# Patient Record
Sex: Male | Born: 1935 | Race: White | Hispanic: No | Marital: Married | Smoking: Never smoker
Health system: Southern US, Community
[De-identification: ages and names within clinical notes are randomized; demographics above are authoritative.]

## PROBLEM LIST (undated history)

## (undated) DIAGNOSIS — E785 Hyperlipidemia, unspecified: Secondary | ICD-10-CM

## (undated) DIAGNOSIS — N184 Chronic kidney disease, stage 4 (severe): Secondary | ICD-10-CM

## (undated) DIAGNOSIS — R011 Cardiac murmur, unspecified: Secondary | ICD-10-CM

## (undated) DIAGNOSIS — I5042 Chronic combined systolic (congestive) and diastolic (congestive) heart failure: Secondary | ICD-10-CM

## (undated) DIAGNOSIS — I739 Peripheral vascular disease, unspecified: Secondary | ICD-10-CM

## (undated) DIAGNOSIS — I10 Essential (primary) hypertension: Secondary | ICD-10-CM

## (undated) DIAGNOSIS — H3530 Unspecified macular degeneration: Secondary | ICD-10-CM

## (undated) DIAGNOSIS — I2510 Atherosclerotic heart disease of native coronary artery without angina pectoris: Secondary | ICD-10-CM

## (undated) DIAGNOSIS — I779 Disorder of arteries and arterioles, unspecified: Secondary | ICD-10-CM

## (undated) DIAGNOSIS — D649 Anemia, unspecified: Secondary | ICD-10-CM

## (undated) DIAGNOSIS — I359 Nonrheumatic aortic valve disorder, unspecified: Secondary | ICD-10-CM

## (undated) DIAGNOSIS — I350 Nonrheumatic aortic (valve) stenosis: Secondary | ICD-10-CM

## (undated) HISTORY — DX: Peripheral vascular disease, unspecified: I73.9

## (undated) HISTORY — DX: Cardiac murmur, unspecified: R01.1

## (undated) HISTORY — DX: Essential (primary) hypertension: I10

## (undated) HISTORY — DX: Atherosclerotic heart disease of native coronary artery without angina pectoris: I25.10

## (undated) HISTORY — DX: Hyperlipidemia, unspecified: E78.5

## (undated) HISTORY — DX: Unspecified macular degeneration: H35.30

## (undated) HISTORY — DX: Disorder of arteries and arterioles, unspecified: I77.9

## (undated) HISTORY — DX: Nonrheumatic aortic valve disorder, unspecified: I35.9

## (undated) HISTORY — DX: Nonrheumatic aortic (valve) stenosis: I35.0

## (undated) HISTORY — DX: Chronic kidney disease, stage 4 (severe): N18.4

## (undated) HISTORY — PX: OTHER SURGICAL HISTORY: SHX169

---

## 1996-02-28 DIAGNOSIS — I2510 Atherosclerotic heart disease of native coronary artery without angina pectoris: Secondary | ICD-10-CM

## 1996-02-28 HISTORY — DX: Atherosclerotic heart disease of native coronary artery without angina pectoris: I25.10

## 1997-01-15 HISTORY — PX: CORONARY ARTERY BYPASS GRAFT: SHX141

## 1997-03-11 HISTORY — PX: OTHER SURGICAL HISTORY: SHX169

## 2010-10-19 DIAGNOSIS — I2510 Atherosclerotic heart disease of native coronary artery without angina pectoris: Secondary | ICD-10-CM

## 2010-10-19 DIAGNOSIS — I10 Essential (primary) hypertension: Secondary | ICD-10-CM

## 2010-10-19 DIAGNOSIS — N185 Chronic kidney disease, stage 5: Secondary | ICD-10-CM | POA: Insufficient documentation

## 2010-10-19 DIAGNOSIS — N184 Chronic kidney disease, stage 4 (severe): Secondary | ICD-10-CM

## 2010-10-19 DIAGNOSIS — E785 Hyperlipidemia, unspecified: Secondary | ICD-10-CM | POA: Insufficient documentation

## 2010-10-19 DIAGNOSIS — I25810 Atherosclerosis of coronary artery bypass graft(s) without angina pectoris: Secondary | ICD-10-CM | POA: Insufficient documentation

## 2010-10-19 DIAGNOSIS — I350 Nonrheumatic aortic (valve) stenosis: Secondary | ICD-10-CM | POA: Insufficient documentation

## 2010-10-19 DIAGNOSIS — I359 Nonrheumatic aortic valve disorder, unspecified: Secondary | ICD-10-CM | POA: Insufficient documentation

## 2010-10-21 ENCOUNTER — Encounter: Payer: Self-pay | Admitting: Thoracic Surgery (Cardiothoracic Vascular Surgery)

## 2010-10-24 ENCOUNTER — Encounter: Payer: Self-pay | Admitting: Thoracic Surgery (Cardiothoracic Vascular Surgery)

## 2010-10-24 ENCOUNTER — Institutional Professional Consult (permissible substitution) (INDEPENDENT_AMBULATORY_CARE_PROVIDER_SITE_OTHER): Payer: Medicare Other | Admitting: Thoracic Surgery (Cardiothoracic Vascular Surgery)

## 2010-10-24 VITALS — BP 136/70 | HR 69 | Resp 18 | Ht 67.0 in | Wt 162.0 lb

## 2010-10-24 DIAGNOSIS — H3530 Unspecified macular degeneration: Secondary | ICD-10-CM | POA: Insufficient documentation

## 2010-10-24 DIAGNOSIS — I359 Nonrheumatic aortic valve disorder, unspecified: Secondary | ICD-10-CM

## 2010-10-24 DIAGNOSIS — R011 Cardiac murmur, unspecified: Secondary | ICD-10-CM | POA: Insufficient documentation

## 2010-10-24 DIAGNOSIS — Z7189 Other specified counseling: Secondary | ICD-10-CM

## 2010-10-24 NOTE — Progress Notes (Signed)
PCP is Norm Parcel., MD Referring Provider is Lesleigh Noe, MD  Chief Complaint  Patient presents with  . Aortic Stenosis    consultation for AVR,    HPI Patient is an 77 year old gentleman from [facility] with known history of aortic stenosis. The patient under went coronary artery bypass grafting x4 in 1998 for severe three-vessel coronary artery disease associated with progressive exertional angina. The patient recovered from this uneventfully and later underwent left carotid endarterectomy for asymptomatic left internal carotid artery stenosis. Since then the patient has done remarkably well from a cardiovascular standpoint. Several years ago he was noted to have aortic stenosis and he has been followed carefully by Dr. Katrinka Blazing. He has never had any symptoms of exertional shortness of breath or chest pain. He recently had a prolonged dizzy spell without syncope. He ruled out for acute myocardial infarction. He was seen in followup by Dr. Katrinka Blazing. His most recent 2-D echocardiogram was reportedly performed on 03/02/2010. This examination was performed as an outpatient and the images from his films are not currently available. However, by report there was severe aortic stenosis with peak velocity across the valve measured 3.34 m/s. The peak and mean transvalvular gradients were estimated to be 45 and 25 mm mercury respectively the aortic valve area was estimated to be between 0.74 and 0.82 cm. Left ventricular systolic function was felt to be normal with ejection fraction estimated at 55-60%. There was moderate left atrial enlargement with mild mitral regurgitation and mild aortic regurgitation. The patient was referred for surgical consultation to discuss long-term treatment options.  Past Medical History  Diagnosis Date  . HTN (hypertension)   . Hyperlipidemia   . Severe aortic stenosis   . CAD (coronary artery disease) 1998    s/p cabg x4  . Carotid arterial disease     . Chronic kidney disease (CKD), stage IV (severe)   . Aortic valve disorders   . Heart murmur   . Macular degeneration     Past Surgical History  Procedure Date  . Carotid endarterectomy, left 03/11/1997    Dr. Hart Rochester  . Left knee arthroscopic sugery   . Right pointer finger surgery secondary to industrial accident   . Coronary artery bypass graft 01/15/97    CABGx4 by Dr Andrey Campanile (LIMA to diag + LAD, SVG to OM1 + OM2)    History reviewed. No pertinent family history.  Social History History  Substance Use Topics  . Smoking status: Never Smoker   . Smokeless tobacco: Never Used  . Alcohol Use: Yes     4oz wine per day    Current Outpatient Prescriptions  Medication Sig Dispense Refill  . aspirin 81 MG tablet Take 81 mg by mouth daily.        . brinzolamide (AZOPT) 1 % ophthalmic suspension Place 1 drop into both eyes 3 (three) times daily.        Marland Kitchen lisinopril (PRINIVIL,ZESTRIL) 20 MG tablet Take 20 mg by mouth daily.        . metoprolol (TOPROL-XL) 50 MG 24 hr tablet Take 50 mg by mouth daily.        . Multiple Vitamins-Minerals (MACUVITE EYE CARE) TABS Take 1 tablet by mouth QID.        Marland Kitchen simvastatin (ZOCOR) 20 MG tablet Take 20 mg by mouth at bedtime.          Allergies  Allergen Reactions  . Penicillins Rash     Review of Systems Review of  systems is notable for the absence of any history of chest pain or chest pressure either with activity or at rest. The patient denies any history of shortness of breath either with activity or at rest. He denies PND, orthopnea, or lower extremity edema. He had the single prolonged dizzy spell that prompted hospital evaluation recently in [place]. He did not have syncope and he has not had any further dizzy spells since that time. He said that the dizzy spell occurred when he had just gone to the bathroom. The remainder of his review of systems is largely unremarkable. He states that his activity level is quite good. His appetite  is stable. His weight is stable. Denies any problems with breathing or chronic cough. He has no difficulty swallowing. Bowel function is regular. He has no difficulty urinating. He does have problems with chronic arthritis and arthralgias afflicting his hips, more so on the left than the right. He also has some trouble with low back pain. These problems limit his mobility considerably. However, he does not require any sort of cane or walker for ambulation, he still drives an automobile, and he tends to the majority of his basic daily needs. He has not had fevers or chills. He has severely decreased vision in the left eye due to macular degeneration. He sees his dentist regularly and recently had some dental work done. The remainder of his review of systems is entirely unremarkable.  Physical Exam Physical exam is notable for a well-appearing elderly male who appears somewhat younger than his stated age. HEENT exam is unremarkable. The neck is supple. There are carotid bruits. There is no lymphadenopathy. Auscultation of the chest reveals clear breath sounds which are symmetrical bilaterally. There is a well-healed median sternotomy scar. The sternum is stable. Cardiovascular exam is notable for regular rate and rhythm. There is a crescendo decrescendo systolic murmur heard along the sternal border with radiation to the neck and across the precordium. No diastolic murmurs are noted. The abdomen is soft nondistended and nontender. There are no palpable masses. The extremities are warm and well-perfused. There is a well-healed surgical scar in the right lower leg from previous saphenous vein harvest. There is mild bilateral lower extremity edema. Distal pulses are not palpable at the ankle. Femoral pulses are possible but slightly diminished bilaterally. Neurologic examination is grossly nonfocal and symmetrical throughout. Rectal and GU exams are both deferred. The remainder of his physical exam is  noncontributory.  Diagnostic Tests Reports from 2-D echocardiogram performed [facility] on 03/02/2010 was reviewed. The results are as discussed previously.  Impression Likely severe aortic stenosis with normal left ventricular function in this elderly gentleman with history of coronary artery disease, status post coronary artery bypass grafting x4 in 1998. The patient's medical problems include severe chronic renal insufficiency. His physical mobility is somewhat diminished over the past couple of years due to degenerative arthritis and pain in his hips. Left ventricular systolic function is preserved. Overall using the STS risk calculator, the estimated risks associated with reoperation for aortic valve replacement in this elderly gentleman suggests that he would be at high risk but not prohibitive risk for elective surgery. Specifically, his risk of mortality would be calculated 9.1% with a 40% risk of combined morbidity or mortality including 3% risk of stroke, 20% risk of acute renal failure, 23% risk of ventilator-dependent respiratory failure, and a more than 20% risk of prolonged hospital length of stay. Overall I suspect that he would tolerate surgery but  it certainly would be at high risk. Trans-catheter aortic valve replacement could be an option appear risks associated with this procedure would likely be similar in terms of risk of mortality. The risk of stroke would certainly be higher with this approach, but his subsequent recovery would likely be much quicker if the procedure was successful . However, at this point I think continued medical therapy remains an option. This patient really hasn't had any symptoms that are definitely attributable to his underlying aortic stenosis. The dizzy spell is certainly concerning, and if similar episodes recur it might be reasonable to consider further workup. However, he has not had any chest discomfort or shortness of breath and he only had a  single dizzy spell without frank syncope. Finally, the peak velocity across the aortic valve reported on echocardiogram performed last January was not greater than 4. It might be reasonable to repeat a transthoracic echocardiogram and follow the patient closely. If he develops any significant signs or symptoms that his aortic valve disease has progressed, repeat catheterization would be indicated to evaluate his underlying coronary artery disease and confirmed the presence of severe aortic stenosis. Transesophageal echocardiogram might be helpful, although this may not be necessary. Transesophageal echocardiogram and cardiac gated CT scanning would be needed if he were to consider transcatheter aortic valve replacement.  Plan The patient plans to followup in the near future with Dr. Katrinka Blazing to discuss matters further. He will call and return to see [place] here as needed. All of his questions have been addressed.

## 2010-10-24 NOTE — Patient Instructions (Signed)
Call to schedule followup appointment with Dr. Katrinka Blazing in the near future. Call promptly should he develop any symptoms of chest pain, shortness of breath, or recurrent dizzy spells with without fainting.

## 2011-03-02 DIAGNOSIS — E7800 Pure hypercholesterolemia, unspecified: Secondary | ICD-10-CM | POA: Diagnosis not present

## 2011-03-02 DIAGNOSIS — I10 Essential (primary) hypertension: Secondary | ICD-10-CM | POA: Diagnosis not present

## 2011-03-02 DIAGNOSIS — I2510 Atherosclerotic heart disease of native coronary artery without angina pectoris: Secondary | ICD-10-CM | POA: Diagnosis not present

## 2011-03-02 DIAGNOSIS — I359 Nonrheumatic aortic valve disorder, unspecified: Secondary | ICD-10-CM | POA: Diagnosis not present

## 2011-03-06 DIAGNOSIS — D3190 Benign neoplasm of unspecified part of unspecified eye: Secondary | ICD-10-CM | POA: Diagnosis not present

## 2011-03-06 DIAGNOSIS — H4060X0 Glaucoma secondary to drugs, unspecified eye, stage unspecified: Secondary | ICD-10-CM | POA: Diagnosis not present

## 2011-03-06 DIAGNOSIS — H409 Unspecified glaucoma: Secondary | ICD-10-CM | POA: Diagnosis not present

## 2011-03-15 DIAGNOSIS — H353290 Exudative age-related macular degeneration, unspecified eye, stage unspecified: Secondary | ICD-10-CM | POA: Diagnosis not present

## 2011-03-15 DIAGNOSIS — H35059 Retinal neovascularization, unspecified, unspecified eye: Secondary | ICD-10-CM | POA: Diagnosis not present

## 2011-04-06 DIAGNOSIS — N184 Chronic kidney disease, stage 4 (severe): Secondary | ICD-10-CM | POA: Diagnosis not present

## 2011-04-06 DIAGNOSIS — I10 Essential (primary) hypertension: Secondary | ICD-10-CM | POA: Diagnosis not present

## 2011-04-26 DIAGNOSIS — H353290 Exudative age-related macular degeneration, unspecified eye, stage unspecified: Secondary | ICD-10-CM | POA: Diagnosis not present

## 2011-04-26 DIAGNOSIS — H35059 Retinal neovascularization, unspecified, unspecified eye: Secondary | ICD-10-CM | POA: Diagnosis not present

## 2011-05-17 DIAGNOSIS — E785 Hyperlipidemia, unspecified: Secondary | ICD-10-CM | POA: Diagnosis not present

## 2011-05-17 DIAGNOSIS — N400 Enlarged prostate without lower urinary tract symptoms: Secondary | ICD-10-CM | POA: Diagnosis not present

## 2011-05-17 DIAGNOSIS — I2510 Atherosclerotic heart disease of native coronary artery without angina pectoris: Secondary | ICD-10-CM | POA: Diagnosis not present

## 2011-05-17 DIAGNOSIS — M25559 Pain in unspecified hip: Secondary | ICD-10-CM | POA: Diagnosis not present

## 2011-05-17 DIAGNOSIS — N19 Unspecified kidney failure: Secondary | ICD-10-CM | POA: Diagnosis not present

## 2011-06-26 DIAGNOSIS — N400 Enlarged prostate without lower urinary tract symptoms: Secondary | ICD-10-CM | POA: Diagnosis not present

## 2011-06-26 DIAGNOSIS — R972 Elevated prostate specific antigen [PSA]: Secondary | ICD-10-CM | POA: Diagnosis not present

## 2011-06-26 DIAGNOSIS — N419 Inflammatory disease of prostate, unspecified: Secondary | ICD-10-CM | POA: Diagnosis not present

## 2011-06-28 DIAGNOSIS — H35059 Retinal neovascularization, unspecified, unspecified eye: Secondary | ICD-10-CM | POA: Diagnosis not present

## 2011-06-28 DIAGNOSIS — H35359 Cystoid macular degeneration, unspecified eye: Secondary | ICD-10-CM | POA: Diagnosis not present

## 2011-06-28 DIAGNOSIS — H353290 Exudative age-related macular degeneration, unspecified eye, stage unspecified: Secondary | ICD-10-CM | POA: Diagnosis not present

## 2011-07-03 DIAGNOSIS — H4060X0 Glaucoma secondary to drugs, unspecified eye, stage unspecified: Secondary | ICD-10-CM | POA: Diagnosis not present

## 2011-07-03 DIAGNOSIS — T380X5A Adverse effect of glucocorticoids and synthetic analogues, initial encounter: Secondary | ICD-10-CM | POA: Diagnosis not present

## 2011-07-03 DIAGNOSIS — H409 Unspecified glaucoma: Secondary | ICD-10-CM | POA: Diagnosis not present

## 2011-07-19 DIAGNOSIS — H4011X0 Primary open-angle glaucoma, stage unspecified: Secondary | ICD-10-CM | POA: Diagnosis not present

## 2011-07-19 DIAGNOSIS — H409 Unspecified glaucoma: Secondary | ICD-10-CM | POA: Diagnosis not present

## 2011-08-02 DIAGNOSIS — N184 Chronic kidney disease, stage 4 (severe): Secondary | ICD-10-CM | POA: Diagnosis not present

## 2011-08-02 DIAGNOSIS — I10 Essential (primary) hypertension: Secondary | ICD-10-CM | POA: Diagnosis not present

## 2011-08-18 DIAGNOSIS — H2510 Age-related nuclear cataract, unspecified eye: Secondary | ICD-10-CM | POA: Diagnosis not present

## 2011-08-18 DIAGNOSIS — H353290 Exudative age-related macular degeneration, unspecified eye, stage unspecified: Secondary | ICD-10-CM | POA: Diagnosis not present

## 2011-08-18 DIAGNOSIS — H43399 Other vitreous opacities, unspecified eye: Secondary | ICD-10-CM | POA: Diagnosis not present

## 2011-08-18 DIAGNOSIS — Z961 Presence of intraocular lens: Secondary | ICD-10-CM | POA: Diagnosis not present

## 2011-08-18 DIAGNOSIS — H538 Other visual disturbances: Secondary | ICD-10-CM | POA: Diagnosis not present

## 2011-08-18 DIAGNOSIS — H4011X0 Primary open-angle glaucoma, stage unspecified: Secondary | ICD-10-CM | POA: Diagnosis not present

## 2011-08-29 DIAGNOSIS — H269 Unspecified cataract: Secondary | ICD-10-CM | POA: Diagnosis not present

## 2011-08-29 DIAGNOSIS — H2510 Age-related nuclear cataract, unspecified eye: Secondary | ICD-10-CM | POA: Diagnosis not present

## 2011-09-13 DIAGNOSIS — I10 Essential (primary) hypertension: Secondary | ICD-10-CM | POA: Diagnosis not present

## 2011-09-13 DIAGNOSIS — N184 Chronic kidney disease, stage 4 (severe): Secondary | ICD-10-CM | POA: Diagnosis not present

## 2011-10-02 ENCOUNTER — Ambulatory Visit: Payer: Medicare Other | Admitting: Physician Assistant

## 2011-10-10 DIAGNOSIS — L570 Actinic keratosis: Secondary | ICD-10-CM | POA: Diagnosis not present

## 2011-10-10 DIAGNOSIS — Z85820 Personal history of malignant melanoma of skin: Secondary | ICD-10-CM | POA: Diagnosis not present

## 2011-10-10 DIAGNOSIS — D485 Neoplasm of uncertain behavior of skin: Secondary | ICD-10-CM | POA: Diagnosis not present

## 2011-10-11 DIAGNOSIS — H353290 Exudative age-related macular degeneration, unspecified eye, stage unspecified: Secondary | ICD-10-CM | POA: Diagnosis not present

## 2011-11-09 DIAGNOSIS — N184 Chronic kidney disease, stage 4 (severe): Secondary | ICD-10-CM | POA: Diagnosis not present

## 2011-11-09 DIAGNOSIS — I10 Essential (primary) hypertension: Secondary | ICD-10-CM | POA: Diagnosis not present

## 2011-11-09 DIAGNOSIS — Z23 Encounter for immunization: Secondary | ICD-10-CM | POA: Diagnosis not present

## 2011-11-29 DIAGNOSIS — I359 Nonrheumatic aortic valve disorder, unspecified: Secondary | ICD-10-CM | POA: Diagnosis not present

## 2011-11-29 DIAGNOSIS — N189 Chronic kidney disease, unspecified: Secondary | ICD-10-CM | POA: Diagnosis not present

## 2011-11-29 DIAGNOSIS — E785 Hyperlipidemia, unspecified: Secondary | ICD-10-CM | POA: Diagnosis not present

## 2011-12-01 DIAGNOSIS — H353290 Exudative age-related macular degeneration, unspecified eye, stage unspecified: Secondary | ICD-10-CM | POA: Diagnosis not present

## 2011-12-01 DIAGNOSIS — H35059 Retinal neovascularization, unspecified, unspecified eye: Secondary | ICD-10-CM | POA: Diagnosis not present

## 2012-01-10 DIAGNOSIS — N184 Chronic kidney disease, stage 4 (severe): Secondary | ICD-10-CM | POA: Diagnosis not present

## 2012-01-10 DIAGNOSIS — I10 Essential (primary) hypertension: Secondary | ICD-10-CM | POA: Diagnosis not present

## 2012-01-18 DIAGNOSIS — H353290 Exudative age-related macular degeneration, unspecified eye, stage unspecified: Secondary | ICD-10-CM | POA: Diagnosis not present

## 2012-01-18 DIAGNOSIS — H35059 Retinal neovascularization, unspecified, unspecified eye: Secondary | ICD-10-CM | POA: Diagnosis not present

## 2012-02-02 DIAGNOSIS — Z13220 Encounter for screening for lipoid disorders: Secondary | ICD-10-CM | POA: Diagnosis not present

## 2012-02-02 DIAGNOSIS — E7800 Pure hypercholesterolemia, unspecified: Secondary | ICD-10-CM | POA: Diagnosis not present

## 2012-02-02 DIAGNOSIS — Z0000 Encounter for general adult medical examination without abnormal findings: Secondary | ICD-10-CM | POA: Diagnosis not present

## 2012-02-07 DIAGNOSIS — H4060X0 Glaucoma secondary to drugs, unspecified eye, stage unspecified: Secondary | ICD-10-CM | POA: Diagnosis not present

## 2012-02-07 DIAGNOSIS — H353190 Nonexudative age-related macular degeneration, unspecified eye, stage unspecified: Secondary | ICD-10-CM | POA: Diagnosis not present

## 2012-02-07 DIAGNOSIS — Z961 Presence of intraocular lens: Secondary | ICD-10-CM | POA: Diagnosis not present

## 2012-02-07 DIAGNOSIS — H409 Unspecified glaucoma: Secondary | ICD-10-CM | POA: Diagnosis not present

## 2012-02-09 DIAGNOSIS — Z13220 Encounter for screening for lipoid disorders: Secondary | ICD-10-CM | POA: Diagnosis not present

## 2012-02-09 DIAGNOSIS — E7800 Pure hypercholesterolemia, unspecified: Secondary | ICD-10-CM | POA: Diagnosis not present

## 2012-03-06 DIAGNOSIS — I2510 Atherosclerotic heart disease of native coronary artery without angina pectoris: Secondary | ICD-10-CM | POA: Diagnosis not present

## 2012-03-06 DIAGNOSIS — I5032 Chronic diastolic (congestive) heart failure: Secondary | ICD-10-CM | POA: Diagnosis not present

## 2012-03-06 DIAGNOSIS — I359 Nonrheumatic aortic valve disorder, unspecified: Secondary | ICD-10-CM | POA: Diagnosis not present

## 2012-03-06 DIAGNOSIS — I10 Essential (primary) hypertension: Secondary | ICD-10-CM | POA: Diagnosis not present

## 2012-03-06 DIAGNOSIS — N184 Chronic kidney disease, stage 4 (severe): Secondary | ICD-10-CM | POA: Diagnosis not present

## 2012-03-11 DIAGNOSIS — I10 Essential (primary) hypertension: Secondary | ICD-10-CM | POA: Diagnosis not present

## 2012-03-11 DIAGNOSIS — N184 Chronic kidney disease, stage 4 (severe): Secondary | ICD-10-CM | POA: Diagnosis not present

## 2012-03-15 DIAGNOSIS — H35059 Retinal neovascularization, unspecified, unspecified eye: Secondary | ICD-10-CM | POA: Diagnosis not present

## 2012-03-15 DIAGNOSIS — H353290 Exudative age-related macular degeneration, unspecified eye, stage unspecified: Secondary | ICD-10-CM | POA: Diagnosis not present

## 2012-05-09 DIAGNOSIS — I10 Essential (primary) hypertension: Secondary | ICD-10-CM | POA: Diagnosis not present

## 2012-05-09 DIAGNOSIS — N184 Chronic kidney disease, stage 4 (severe): Secondary | ICD-10-CM | POA: Diagnosis not present

## 2012-05-09 DIAGNOSIS — H353290 Exudative age-related macular degeneration, unspecified eye, stage unspecified: Secondary | ICD-10-CM | POA: Diagnosis not present

## 2012-05-09 DIAGNOSIS — H35059 Retinal neovascularization, unspecified, unspecified eye: Secondary | ICD-10-CM | POA: Diagnosis not present

## 2012-07-11 DIAGNOSIS — I10 Essential (primary) hypertension: Secondary | ICD-10-CM | POA: Diagnosis not present

## 2012-07-11 DIAGNOSIS — N184 Chronic kidney disease, stage 4 (severe): Secondary | ICD-10-CM | POA: Diagnosis not present

## 2012-07-18 DIAGNOSIS — H35059 Retinal neovascularization, unspecified, unspecified eye: Secondary | ICD-10-CM | POA: Diagnosis not present

## 2012-07-18 DIAGNOSIS — H353290 Exudative age-related macular degeneration, unspecified eye, stage unspecified: Secondary | ICD-10-CM | POA: Diagnosis not present

## 2012-07-19 DIAGNOSIS — M25559 Pain in unspecified hip: Secondary | ICD-10-CM | POA: Diagnosis not present

## 2012-07-19 DIAGNOSIS — M545 Low back pain: Secondary | ICD-10-CM | POA: Diagnosis not present

## 2012-09-03 DIAGNOSIS — I359 Nonrheumatic aortic valve disorder, unspecified: Secondary | ICD-10-CM | POA: Diagnosis not present

## 2012-09-03 DIAGNOSIS — I2510 Atherosclerotic heart disease of native coronary artery without angina pectoris: Secondary | ICD-10-CM | POA: Diagnosis not present

## 2012-09-03 DIAGNOSIS — I10 Essential (primary) hypertension: Secondary | ICD-10-CM | POA: Diagnosis not present

## 2012-09-03 DIAGNOSIS — I5032 Chronic diastolic (congestive) heart failure: Secondary | ICD-10-CM | POA: Diagnosis not present

## 2012-09-03 DIAGNOSIS — N184 Chronic kidney disease, stage 4 (severe): Secondary | ICD-10-CM | POA: Diagnosis not present

## 2012-09-12 DIAGNOSIS — I10 Essential (primary) hypertension: Secondary | ICD-10-CM | POA: Diagnosis not present

## 2012-09-12 DIAGNOSIS — N184 Chronic kidney disease, stage 4 (severe): Secondary | ICD-10-CM | POA: Diagnosis not present

## 2012-09-24 DIAGNOSIS — H353290 Exudative age-related macular degeneration, unspecified eye, stage unspecified: Secondary | ICD-10-CM | POA: Diagnosis not present

## 2012-09-24 DIAGNOSIS — H35059 Retinal neovascularization, unspecified, unspecified eye: Secondary | ICD-10-CM | POA: Diagnosis not present

## 2012-09-24 DIAGNOSIS — H35369 Drusen (degenerative) of macula, unspecified eye: Secondary | ICD-10-CM | POA: Diagnosis not present

## 2012-09-24 DIAGNOSIS — H353190 Nonexudative age-related macular degeneration, unspecified eye, stage unspecified: Secondary | ICD-10-CM | POA: Diagnosis not present

## 2012-11-07 DIAGNOSIS — I10 Essential (primary) hypertension: Secondary | ICD-10-CM | POA: Diagnosis not present

## 2012-11-07 DIAGNOSIS — N184 Chronic kidney disease, stage 4 (severe): Secondary | ICD-10-CM | POA: Diagnosis not present

## 2012-11-12 DIAGNOSIS — Z23 Encounter for immunization: Secondary | ICD-10-CM | POA: Diagnosis not present

## 2012-12-09 DIAGNOSIS — I10 Essential (primary) hypertension: Secondary | ICD-10-CM | POA: Diagnosis not present

## 2012-12-09 DIAGNOSIS — N184 Chronic kidney disease, stage 4 (severe): Secondary | ICD-10-CM | POA: Diagnosis not present

## 2012-12-25 DIAGNOSIS — H353290 Exudative age-related macular degeneration, unspecified eye, stage unspecified: Secondary | ICD-10-CM | POA: Diagnosis not present

## 2012-12-25 DIAGNOSIS — H35059 Retinal neovascularization, unspecified, unspecified eye: Secondary | ICD-10-CM | POA: Diagnosis not present

## 2013-01-14 DIAGNOSIS — Z85828 Personal history of other malignant neoplasm of skin: Secondary | ICD-10-CM | POA: Diagnosis not present

## 2013-01-14 DIAGNOSIS — L570 Actinic keratosis: Secondary | ICD-10-CM | POA: Diagnosis not present

## 2013-01-20 DIAGNOSIS — N184 Chronic kidney disease, stage 4 (severe): Secondary | ICD-10-CM | POA: Diagnosis not present

## 2013-01-20 DIAGNOSIS — I10 Essential (primary) hypertension: Secondary | ICD-10-CM | POA: Diagnosis not present

## 2013-02-04 ENCOUNTER — Encounter: Payer: Self-pay | Admitting: Interventional Cardiology

## 2013-02-04 ENCOUNTER — Encounter: Payer: Self-pay | Admitting: *Deleted

## 2013-02-05 ENCOUNTER — Encounter: Payer: Self-pay | Admitting: Interventional Cardiology

## 2013-02-05 ENCOUNTER — Ambulatory Visit (INDEPENDENT_AMBULATORY_CARE_PROVIDER_SITE_OTHER): Payer: Medicare Other | Admitting: Interventional Cardiology

## 2013-02-05 VITALS — BP 140/82 | HR 66 | Ht 66.0 in | Wt 169.0 lb

## 2013-02-05 DIAGNOSIS — I2510 Atherosclerotic heart disease of native coronary artery without angina pectoris: Secondary | ICD-10-CM | POA: Diagnosis not present

## 2013-02-05 DIAGNOSIS — N184 Chronic kidney disease, stage 4 (severe): Secondary | ICD-10-CM | POA: Diagnosis not present

## 2013-02-05 DIAGNOSIS — Z0000 Encounter for general adult medical examination without abnormal findings: Secondary | ICD-10-CM | POA: Diagnosis not present

## 2013-02-05 DIAGNOSIS — Z23 Encounter for immunization: Secondary | ICD-10-CM | POA: Diagnosis not present

## 2013-02-05 DIAGNOSIS — Z79899 Other long term (current) drug therapy: Secondary | ICD-10-CM | POA: Diagnosis not present

## 2013-02-05 DIAGNOSIS — E785 Hyperlipidemia, unspecified: Secondary | ICD-10-CM

## 2013-02-05 DIAGNOSIS — E7800 Pure hypercholesterolemia, unspecified: Secondary | ICD-10-CM | POA: Diagnosis not present

## 2013-02-05 DIAGNOSIS — I10 Essential (primary) hypertension: Secondary | ICD-10-CM

## 2013-02-05 DIAGNOSIS — I359 Nonrheumatic aortic valve disorder, unspecified: Secondary | ICD-10-CM

## 2013-02-05 DIAGNOSIS — I779 Disorder of arteries and arterioles, unspecified: Secondary | ICD-10-CM | POA: Diagnosis not present

## 2013-02-05 NOTE — Patient Instructions (Signed)
Your physician recommends that you continue on your current medications as directed. Please refer to the Current Medication list given to you today.  Your physician wants you to follow-up in: 6 months You will receive a reminder letter in the mail two months in advance. If you don't receive a letter, please call our office to schedule the follow-up appointment.  Call the office if you are experiencing the following symptoms: Chest pain, Shortness of breath, Syncope(passing out). [phone]

## 2013-02-05 NOTE — Progress Notes (Signed)
Patient ID: Shawn Long, male   DOB: Nov 13, 1922, 79 y.o.   MRN: [id number]    [address], [address] [place], [place]  [address] Phone: [phone] Fax:  [phone]  Date:  02/05/2013   ID:  Shawn Long, DOB 05/02/1922, MRN [id number]  PCP:  Norm Parcel., MD   ASSESSMENT:  1. Severe aortic stenosis, asymptomatic 2. Coronary artery disease with prior coronary bypass surgery. No angina. 3. Hypertension under control 4. Stage IV chronic kidney disease being followed by Dr. Christell Constant in [facility].  PLAN:  1. In absence of cardiovascular symptoms, no specific plans for further cardiac evaluation or invasive procedures. 2. Cautioned to call immediately if angina, dyspnea, or syncope. 3. Continue to follow kidney function (Dr. Christell Constant)   SUBJECTIVE: AIRON Long is a 79 y.o. male who denies angina, syncope, and dyspnea. There is no lower extremity swelling. According to the patient, he has been told by Dr. Christell Constant that his renal function is stable. He is still ambulatory. He is accompanied today by his wife. We had discussion concerning improving outcomes with TAVR.   Wt Readings from Last 3 Encounters:  02/05/13 169 lb (76.658 kg)  10/24/10 162 lb (73.483 kg)     Past Medical History  Diagnosis Date  . HTN (hypertension)   . Hyperlipidemia   . Severe aortic stenosis   . CAD (coronary artery disease) 1998    s/p cabg x4  . Carotid arterial disease   . Chronic kidney disease (CKD), stage IV (severe)   . Aortic valve disorders   . Heart murmur   . Macular degeneration     Current Outpatient Prescriptions  Medication Sig Dispense Refill  . aspirin 81 MG tablet Take 81 mg by mouth daily.        . brinzolamide (AZOPT) 1 % ophthalmic suspension Place 1 drop into both eyes 3 (three) times daily.        Marland Kitchen latanoprost (XALATAN) 0.005 % ophthalmic solution 1 drop at bedtime.      . metoprolol (TOPROL-XL) 50 MG 24 hr tablet Take 50 mg by mouth daily.        . Misc Natural  Products (OSTEO BI-FLEX ADV JOINT SHIELD PO) Take by mouth.      . Multiple Vitamins-Minerals (MACUVITE EYE CARE) TABS Take 1 tablet by mouth QID.        Marland Kitchen simvastatin (ZOCOR) 20 MG tablet Take 20 mg by mouth at bedtime.         No current facility-administered medications for this visit.    Allergies:    Allergies  Allergen Reactions  . Penicillins Rash    Social History:  The patient  reports that he has never smoked. He has never used smokeless tobacco. He reports that he drinks alcohol. He reports that he does not use illicit drugs.   ROS:  Please see the history of present illness.   All other systems reviewed and negative.   OBJECTIVE: VS:  BP 140/82  Pulse 66  Ht 5\' 6"  (1.676 m)  Wt 169 lb (76.658 kg)  BMI 27.29 kg/m2 Well nourished, well developed, in no acute distress, elderly  HEENT: normal Neck: JVD flat. Carotid bruit bilateral transmitted bruit  Cardiac:  normal S1, S2; RRR; 4/6 crescendo decrescendo systolic murmur of aortic stenosis. No diastolic murmur.  Lungs:  clear to auscultation bilaterally, no wheezing, rhonchi or rales Abd: soft, nontender, no hepatomegaly Ext: Edema absent. Pulses 2+  Skin: warm and dry  Neuro:  CNs 2-12 intact, no focal abnormalities noted  EKG:  Normal sinus rhythm with biatrial abnormality, first-degree AV block, no significant change compared to prior       Signed, Darci Needle III, MD 02/05/2013 11:31 AM  Past Medical History  Hypertension   Hyperlipidemia   Coronary artery disease, status post bypass surgery, 1998 (Dr. 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 Christell Constant)   Anemia   Aortic valve stenosis (Dr. Katrinka Blazing)

## 2013-02-17 DIAGNOSIS — IMO0002 Reserved for concepts with insufficient information to code with codable children: Secondary | ICD-10-CM | POA: Diagnosis not present

## 2013-02-17 DIAGNOSIS — S0180XA Unspecified open wound of other part of head, initial encounter: Secondary | ICD-10-CM | POA: Diagnosis not present

## 2013-03-25 DIAGNOSIS — H353290 Exudative age-related macular degeneration, unspecified eye, stage unspecified: Secondary | ICD-10-CM | POA: Diagnosis not present

## 2013-03-25 DIAGNOSIS — H35059 Retinal neovascularization, unspecified, unspecified eye: Secondary | ICD-10-CM | POA: Diagnosis not present

## 2013-04-25 DIAGNOSIS — I10 Essential (primary) hypertension: Secondary | ICD-10-CM | POA: Diagnosis not present

## 2013-04-25 DIAGNOSIS — N184 Chronic kidney disease, stage 4 (severe): Secondary | ICD-10-CM | POA: Diagnosis not present

## 2013-06-18 DIAGNOSIS — H524 Presbyopia: Secondary | ICD-10-CM | POA: Diagnosis not present

## 2013-06-18 DIAGNOSIS — H35059 Retinal neovascularization, unspecified, unspecified eye: Secondary | ICD-10-CM | POA: Diagnosis not present

## 2013-06-18 DIAGNOSIS — H409 Unspecified glaucoma: Secondary | ICD-10-CM | POA: Diagnosis not present

## 2013-06-18 DIAGNOSIS — H4011X0 Primary open-angle glaucoma, stage unspecified: Secondary | ICD-10-CM | POA: Diagnosis not present

## 2013-06-18 DIAGNOSIS — H353290 Exudative age-related macular degeneration, unspecified eye, stage unspecified: Secondary | ICD-10-CM | POA: Diagnosis not present

## 2013-06-24 DIAGNOSIS — H35369 Drusen (degenerative) of macula, unspecified eye: Secondary | ICD-10-CM | POA: Diagnosis not present

## 2013-06-24 DIAGNOSIS — H353290 Exudative age-related macular degeneration, unspecified eye, stage unspecified: Secondary | ICD-10-CM | POA: Diagnosis not present

## 2013-06-24 DIAGNOSIS — H4011X0 Primary open-angle glaucoma, stage unspecified: Secondary | ICD-10-CM | POA: Diagnosis not present

## 2013-07-24 DIAGNOSIS — N184 Chronic kidney disease, stage 4 (severe): Secondary | ICD-10-CM | POA: Diagnosis not present

## 2013-07-24 DIAGNOSIS — I10 Essential (primary) hypertension: Secondary | ICD-10-CM | POA: Diagnosis not present

## 2013-08-21 DIAGNOSIS — L570 Actinic keratosis: Secondary | ICD-10-CM | POA: Diagnosis not present

## 2013-08-21 DIAGNOSIS — Z85828 Personal history of other malignant neoplasm of skin: Secondary | ICD-10-CM | POA: Diagnosis not present

## 2013-08-21 DIAGNOSIS — D0420 Carcinoma in situ of skin of unspecified ear and external auricular canal: Secondary | ICD-10-CM | POA: Diagnosis not present

## 2013-09-04 ENCOUNTER — Ambulatory Visit: Payer: Medicare Other | Admitting: Interventional Cardiology

## 2013-09-15 ENCOUNTER — Encounter: Payer: Self-pay | Admitting: Interventional Cardiology

## 2013-09-15 ENCOUNTER — Ambulatory Visit (INDEPENDENT_AMBULATORY_CARE_PROVIDER_SITE_OTHER): Payer: Medicare Other | Admitting: Interventional Cardiology

## 2013-09-15 VITALS — BP 137/64 | HR 73 | Ht 67.0 in | Wt 170.0 lb

## 2013-09-15 DIAGNOSIS — I359 Nonrheumatic aortic valve disorder, unspecified: Secondary | ICD-10-CM

## 2013-09-15 DIAGNOSIS — E785 Hyperlipidemia, unspecified: Secondary | ICD-10-CM

## 2013-09-15 DIAGNOSIS — I10 Essential (primary) hypertension: Secondary | ICD-10-CM | POA: Diagnosis not present

## 2013-09-15 DIAGNOSIS — I25810 Atherosclerosis of coronary artery bypass graft(s) without angina pectoris: Secondary | ICD-10-CM

## 2013-09-15 DIAGNOSIS — I350 Nonrheumatic aortic (valve) stenosis: Secondary | ICD-10-CM

## 2013-09-15 NOTE — Patient Instructions (Addendum)
Your physician has requested that you have an echocardiogram. Echocardiography is a painless test that uses sound waves to create images of your heart. It provides your doctor with information about the size and shape of your heart and how well your heart's chambers and valves are working. This procedure takes approximately one hour. There are no restrictions for this procedure.  Your physician wants you to follow-up in: 6 months with Dr. [name]. You will receive a reminder letter in the mail two months in advance. If you don't receive a letter, please call our office to schedule the follow-up appointment.

## 2013-09-15 NOTE — Progress Notes (Signed)
Patient ID: Shawn Long, male   DOB: 1922-12-11, 80 y.o.   MRN: [id number]    [address] [place],   [address] Phone: [phone] Fax:  [phone]  Date:  09/15/2013   ID:  Shawn Long, DOB January 29, 1923, MRN [id number]  PCP:  Tonye Becket., MD   ASSESSMENT:  1. Severe aortic stenosis, symptomatically stable 2. Coronary artery disease, asymptomatic 3. Hypertension, controlled 4. CKD., stage IV, stable  PLAN:  1. 2-D Doppler echocardiogram to reassess left ear and LV function 2. We again had a considerable conversation concerning possible management of aortic stenosis with TAVR. Given the patient's renal impairment, he has been reluctant to have any evaluation for fear of precipitating dialysis. He does state however that if he develops heart failure or symptoms related to aortic stenosis, he would like to [place] for an aggressive treatment regimen since he feels well otherwise. We discussed his risk of sudden death being greater than 1% per year and possibly an excess of 5% per year. We discussed the possibility that symptoms could be acute in onset and prevent adequate planning and subsequent TAVR. He refuses any specific cardiac workup at this time other than echocardiography. 3. Continue followup with local nephrologist   SUBJECTIVE: Shawn Long is a 80 y.o. male is accompanied by his wife and has no cardiovascular complaints. He specifically denies syncope/dizziness, angina, and dyspnea. Renal function is been stable with a creatinine of around 3.5 when checked in February by his nephrologist   Wt Readings from Last 3 Encounters:  09/15/13 170 lb (77.111 kg)  02/05/13 169 lb (76.658 kg)  10/24/10 162 lb (73.483 kg)     Past Medical History  Diagnosis Date  . HTN (hypertension)   . Hyperlipidemia   . Severe aortic stenosis   . CAD (coronary artery disease) 1998    s/p cabg x4  . Carotid arterial disease   . Chronic kidney disease (CKD), stage IV  (severe)   . Aortic valve disorders   . Heart murmur   . Macular degeneration     Current Outpatient Prescriptions  Medication Sig Dispense Refill  . aspirin 81 MG tablet Take 81 mg by mouth daily.        . brinzolamide (AZOPT) 1 % ophthalmic suspension Place 1 drop into both eyes 3 (three) times daily.        . furosemide (LASIX) 20 MG tablet       . latanoprost (XALATAN) 0.005 % ophthalmic solution 1 drop at bedtime.      . metoprolol (TOPROL-XL) 50 MG 24 hr tablet Take 50 mg by mouth daily.        . Misc Natural Products (OSTEO BI-FLEX ADV JOINT SHIELD PO) Take by mouth.      . Multiple Vitamins-Minerals (MACUVITE EYE CARE) TABS Take 1 tablet by mouth QID.        Marland Kitchen simvastatin (ZOCOR) 20 MG tablet Take 20 mg by mouth at bedtime.         No current facility-administered medications for this visit.    Allergies:    Allergies  Allergen Reactions  . Penicillins Rash    Social History:  The patient  reports that he has never smoked. He has never used smokeless tobacco. He reports that he drinks alcohol. He reports that he does not use illicit drugs.   ROS:  Please see the history of present illness.   He denies edema. No orthopnea PND. Appetite is been  stable. No palpitations. No transient neurological symptoms.   All other systems reviewed and negative.   OBJECTIVE: VS:  BP 137/64  Pulse 73  Ht 5\' 7"  (1.702 m)  Wt 170 lb (77.111 kg)  BMI 26.62 kg/m2 Well nourished, well developed, in no acute distress, appears than stated age 56: normal Neck: JVD flat. Carotid bruit markedly decreased carotid upstroke. Transmitted bruits bilateral.  Cardiac:  normal S1, S2; RRR; no murmur Lungs:  clear to auscultation bilaterally, no wheezing, rhonchi or rales Abd: soft, nontender, no hepatomegaly Ext: Edema trace bilateral. Pulses 2+ upper extremities Skin: warm and dry Neuro:  CNs 2-12 intact, no focal abnormalities noted  EKG:  Not perform       Signed, Illene Labrador III,  MD 09/15/2013 2:01 PM

## 2013-09-25 ENCOUNTER — Ambulatory Visit (HOSPITAL_COMMUNITY): Payer: Medicare Other | Attending: Interventional Cardiology | Admitting: Radiology

## 2013-09-25 DIAGNOSIS — I359 Nonrheumatic aortic valve disorder, unspecified: Secondary | ICD-10-CM | POA: Diagnosis not present

## 2013-09-25 DIAGNOSIS — I350 Nonrheumatic aortic (valve) stenosis: Secondary | ICD-10-CM

## 2013-09-25 NOTE — Progress Notes (Signed)
Echocardiogram performed.  

## 2013-10-03 ENCOUNTER — Telehealth: Payer: Self-pay

## 2013-10-03 NOTE — Telephone Encounter (Signed)
Message copied by Lamar Laundry on Fri Oct 03, 2013  2:08 PM ------      Message from: Daneen Schick      Created: Fri Sep 26, 2013  2:59 PM       Echo reveals severe Aortic Valve stenosis but not significantly changed since prior study. Maintain current course,. ------

## 2013-10-03 NOTE — Telephone Encounter (Signed)
called to pt echo results.lmtcb

## 2013-10-03 NOTE — Telephone Encounter (Signed)
Follow up  ° ° °Patient returning call back to nurse  °

## 2013-10-06 NOTE — Telephone Encounter (Signed)
Follow up ° ° ° ° ° °Returning Lisa's call °

## 2013-10-06 NOTE — Telephone Encounter (Signed)
returned pt call. called to gove pt echo results.lmtcb

## 2013-10-25 ENCOUNTER — Encounter: Payer: Self-pay | Admitting: Interventional Cardiology

## 2013-11-21 DIAGNOSIS — Z23 Encounter for immunization: Secondary | ICD-10-CM | POA: Diagnosis not present

## 2013-11-25 DIAGNOSIS — H353190 Nonexudative age-related macular degeneration, unspecified eye, stage unspecified: Secondary | ICD-10-CM | POA: Diagnosis not present

## 2013-11-25 DIAGNOSIS — H4011X0 Primary open-angle glaucoma, stage unspecified: Secondary | ICD-10-CM | POA: Diagnosis not present

## 2013-11-25 DIAGNOSIS — H353290 Exudative age-related macular degeneration, unspecified eye, stage unspecified: Secondary | ICD-10-CM | POA: Diagnosis not present

## 2013-12-23 DIAGNOSIS — Z08 Encounter for follow-up examination after completed treatment for malignant neoplasm: Secondary | ICD-10-CM | POA: Diagnosis not present

## 2013-12-23 DIAGNOSIS — L570 Actinic keratosis: Secondary | ICD-10-CM | POA: Diagnosis not present

## 2013-12-23 DIAGNOSIS — Z85820 Personal history of malignant melanoma of skin: Secondary | ICD-10-CM | POA: Diagnosis not present

## 2014-01-27 ENCOUNTER — Encounter: Payer: Self-pay | Admitting: Interventional Cardiology

## 2014-01-27 DIAGNOSIS — N184 Chronic kidney disease, stage 4 (severe): Secondary | ICD-10-CM | POA: Diagnosis not present

## 2014-01-27 DIAGNOSIS — I10 Essential (primary) hypertension: Secondary | ICD-10-CM | POA: Diagnosis not present

## 2014-02-18 DIAGNOSIS — Z1389 Encounter for screening for other disorder: Secondary | ICD-10-CM | POA: Diagnosis not present

## 2014-02-18 DIAGNOSIS — I10 Essential (primary) hypertension: Secondary | ICD-10-CM | POA: Diagnosis not present

## 2014-02-18 DIAGNOSIS — E780 Pure hypercholesterolemia: Secondary | ICD-10-CM | POA: Diagnosis not present

## 2014-02-18 DIAGNOSIS — N184 Chronic kidney disease, stage 4 (severe): Secondary | ICD-10-CM | POA: Diagnosis not present

## 2014-04-10 ENCOUNTER — Ambulatory Visit (INDEPENDENT_AMBULATORY_CARE_PROVIDER_SITE_OTHER): Payer: Medicare Other | Admitting: Interventional Cardiology

## 2014-04-10 ENCOUNTER — Encounter: Payer: Self-pay | Admitting: Interventional Cardiology

## 2014-04-10 VITALS — BP 100/64 | HR 74 | Ht 67.0 in | Wt 173.8 lb

## 2014-04-10 DIAGNOSIS — E785 Hyperlipidemia, unspecified: Secondary | ICD-10-CM

## 2014-04-10 DIAGNOSIS — I359 Nonrheumatic aortic valve disorder, unspecified: Secondary | ICD-10-CM | POA: Diagnosis not present

## 2014-04-10 DIAGNOSIS — I25810 Atherosclerosis of coronary artery bypass graft(s) without angina pectoris: Secondary | ICD-10-CM | POA: Diagnosis not present

## 2014-04-10 DIAGNOSIS — I10 Essential (primary) hypertension: Secondary | ICD-10-CM

## 2014-04-10 DIAGNOSIS — I350 Nonrheumatic aortic (valve) stenosis: Secondary | ICD-10-CM

## 2014-04-10 DIAGNOSIS — N184 Chronic kidney disease, stage 4 (severe): Secondary | ICD-10-CM | POA: Diagnosis not present

## 2014-04-10 DIAGNOSIS — I779 Disorder of arteries and arterioles, unspecified: Secondary | ICD-10-CM | POA: Diagnosis not present

## 2014-04-10 DIAGNOSIS — I739 Peripheral vascular disease, unspecified: Secondary | ICD-10-CM

## 2014-04-10 NOTE — Progress Notes (Signed)
Patient ID: Shawn Long, male   DOB: [birth year], 81 y.o.   MRN: [id number]    Cardiology Office Note   Date:  04/10/2014   ID:  Shawn Long, DOB [birth year], MRN [id number]  PCP:  [name], MD  Cardiologist:   Sinclair Grooms, MD   Chief Complaint  Patient presents with  . Numbness    left hand      History of Present Illness: Shawn Long is a 81 y.o. male who presents for  Aortic stenosis. He has a history of coronary artery disease with prior coronary bypass grafting. There is an additional history of chronic kidney disease, stage IV.Marland Kitchen He would be in favor of TAVR but refused open surgical valve replacement.   he denies orthopnea, PND, but does have mild lower extremity edema. He has not had syncope. He denies angina.    Past Medical History  Diagnosis Date  . HTN (hypertension)   . Hyperlipidemia   . Severe aortic stenosis   . CAD (coronary artery disease) 1998    s/p cabg x4  . Carotid arterial disease   . Chronic kidney disease (CKD), stage IV (severe)   . Aortic valve disorders   . Heart murmur   . Macular degeneration     Past Surgical History  Procedure Laterality Date  . Carotid endarterectomy, left  03/11/1997    Dr. Kellie Simmering  . Left knee arthroscopic sugery    . Right pointer finger surgery secondary to industrial accident    . Coronary artery bypass graft  01/15/97    CABGx4 by Dr Redmond Pulling (LIMA to diag + LAD, SVG to OM1 + OM2)     Current Outpatient Prescriptions  Medication Sig Dispense Refill  . aspirin 81 MG tablet Take 81 mg by mouth daily.      . brinzolamide (AZOPT) 1 % ophthalmic suspension Place 1 drop into both eyes 2 (two) times daily.     . furosemide (LASIX) 20 MG tablet Take 20 mg by mouth daily.     Marland Kitchen latanoprost (XALATAN) 0.005 % ophthalmic solution Place 2 drops into both eyes at bedtime.     . metoprolol (TOPROL-XL) 50 MG 24 hr tablet Take 50 mg by mouth daily.      . Misc Natural Products (OSTEO BI-FLEX ADV JOINT SHIELD  PO) Take by mouth.    . Multiple Vitamins-Minerals (MACUVITE EYE CARE) TABS Take 1 tablet by mouth QID.      Marland Kitchen simvastatin (ZOCOR) 40 MG tablet Take 40 mg by mouth daily at 6 PM.      No current facility-administered medications for this visit.    Allergies:   Penicillins    Social History:  The patient  reports that he has never smoked. He has never used smokeless tobacco. He reports that he drinks alcohol. He reports that he does not use illicit drugs.   Family History:  The patient's family history includes Cancer - Ovarian in his mother.    ROS:  Please see the history of present illness.   Otherwise, review of systems are positive for  Bilateral hip pain with ambulation..   All other systems are reviewed and negative.    PHYSICAL EXAM: VS:  BP 100/64 mmHg  Pulse 74  Ht 5\' 7"  (1.702 m)  Wt 173 lb 12.8 oz (78.835 kg)  BMI 27.21 kg/m2 , BMI Body mass index is 27.21 kg/(m^2). GEN: Well nourished, well developed, in no acute distress HEENT: normal Neck: no JVD, carotid  bruits, or masses Cardiac:  3 to 4/6 crescendo decrescendo murmur of aortic stenosis right upper sternal border.RRR,  rubs, or gallops,no edema. Respiratory:  clear to auscultation bilaterally, normal work of breathing GI: soft, nontender, nondistended, + BS MS: no deformity or atrophy Skin: warm and dry, no rash Neuro:  Strength and sensation are intact Psych: euthymic mood, full affect   EKG:  EKG is ordered today. The ekg ordered today demonstrates  Sinus rhythm with nonspecific T-wave flattening   Recent Labs: No results found for requested labs within last 365 days.    Lipid Panel No results found for: CHOL, TRIG, HDL, CHOLHDL, VLDL, LDLCALC, LDLDIRECT    Wt Readings from Last 3 Encounters:  04/10/14 173 lb 12.8 oz (78.835 kg)  09/15/13 170 lb (77.111 kg)  02/05/13 169 lb (76.658 kg)      Other studies Reviewed: Additional studies/ records that were reviewed today include: . Review of the  above records demonstra   ASSESSMENT AND PLAN:  1.  Severe aortic stenosis, clinically stable and asymptomatic  2. Stage 4 chronic kidney disease , followed by an independent cardiologist in [place] 3. Coronary artery disease with prior coronary bypass grafting, stable without angina  4. Asymptomatic bilateral carotid stenoses /bruits  5. Essential hypertension under excellent control   Current medicines are reviewed at length with the patient today.  The patient does not have concerns regarding medicines.  The following changes have been made:  no change. Our plan at this time as conservative management. She developed any symptom of aortic stenosis, we will then begin the workup for potential transaortic valve replacement (TAVR)  Labs/ tests ordered today include:   Consider repeat echocardiogram on return visit. We will determine that at at particular time.  No orders of the defined types were placed in this encounter.     Disposition:   FU with  Linard Millers in 6 months   Signed, Sinclair Grooms, MD  04/10/2014 2:34 PM    [address], [place], [place]  [address] Phone: [phone]; Fax: [phone]

## 2014-04-10 NOTE — Patient Instructions (Signed)
Your physician wants you to follow-up in:  6 months. You will receive a reminder letter in the mail two months in advance. If you don't receive a letter, please call our office to schedule the follow-up appointment.   

## 2014-06-04 DIAGNOSIS — N184 Chronic kidney disease, stage 4 (severe): Secondary | ICD-10-CM | POA: Diagnosis not present

## 2014-06-04 DIAGNOSIS — I10 Essential (primary) hypertension: Secondary | ICD-10-CM | POA: Diagnosis not present

## 2014-06-22 DIAGNOSIS — H409 Unspecified glaucoma: Secondary | ICD-10-CM | POA: Diagnosis not present

## 2014-06-22 DIAGNOSIS — H4010X0 Unspecified open-angle glaucoma, stage unspecified: Secondary | ICD-10-CM | POA: Diagnosis not present

## 2014-06-24 ENCOUNTER — Encounter: Payer: Self-pay | Admitting: Interventional Cardiology

## 2014-06-25 DIAGNOSIS — C44519 Basal cell carcinoma of skin of other part of trunk: Secondary | ICD-10-CM | POA: Diagnosis not present

## 2014-06-25 DIAGNOSIS — Z85828 Personal history of other malignant neoplasm of skin: Secondary | ICD-10-CM | POA: Diagnosis not present

## 2014-06-25 DIAGNOSIS — L570 Actinic keratosis: Secondary | ICD-10-CM | POA: Diagnosis not present

## 2014-06-25 DIAGNOSIS — Z08 Encounter for follow-up examination after completed treatment for malignant neoplasm: Secondary | ICD-10-CM | POA: Diagnosis not present

## 2014-06-29 DIAGNOSIS — H409 Unspecified glaucoma: Secondary | ICD-10-CM | POA: Diagnosis not present

## 2014-06-29 DIAGNOSIS — H4010X0 Unspecified open-angle glaucoma, stage unspecified: Secondary | ICD-10-CM | POA: Diagnosis not present

## 2014-07-14 DIAGNOSIS — H409 Unspecified glaucoma: Secondary | ICD-10-CM | POA: Diagnosis not present

## 2014-07-14 DIAGNOSIS — H4010X0 Unspecified open-angle glaucoma, stage unspecified: Secondary | ICD-10-CM | POA: Diagnosis not present

## 2014-08-13 DIAGNOSIS — H4010X0 Unspecified open-angle glaucoma, stage unspecified: Secondary | ICD-10-CM | POA: Diagnosis not present

## 2014-08-13 DIAGNOSIS — H409 Unspecified glaucoma: Secondary | ICD-10-CM | POA: Diagnosis not present

## 2014-08-20 DIAGNOSIS — H35053 Retinal neovascularization, unspecified, bilateral: Secondary | ICD-10-CM | POA: Diagnosis not present

## 2014-08-20 DIAGNOSIS — H3531 Nonexudative age-related macular degeneration: Secondary | ICD-10-CM | POA: Diagnosis not present

## 2014-08-27 DIAGNOSIS — E780 Pure hypercholesterolemia: Secondary | ICD-10-CM | POA: Diagnosis not present

## 2014-08-27 DIAGNOSIS — I2510 Atherosclerotic heart disease of native coronary artery without angina pectoris: Secondary | ICD-10-CM | POA: Diagnosis not present

## 2014-08-27 DIAGNOSIS — M545 Low back pain: Secondary | ICD-10-CM | POA: Diagnosis not present

## 2014-08-27 DIAGNOSIS — I10 Essential (primary) hypertension: Secondary | ICD-10-CM | POA: Diagnosis not present

## 2014-08-27 DIAGNOSIS — N184 Chronic kidney disease, stage 4 (severe): Secondary | ICD-10-CM | POA: Diagnosis not present

## 2014-08-27 DIAGNOSIS — I779 Disorder of arteries and arterioles, unspecified: Secondary | ICD-10-CM | POA: Diagnosis not present

## 2014-08-27 DIAGNOSIS — I350 Nonrheumatic aortic (valve) stenosis: Secondary | ICD-10-CM | POA: Diagnosis not present

## 2014-08-27 DIAGNOSIS — Z23 Encounter for immunization: Secondary | ICD-10-CM | POA: Diagnosis not present

## 2014-09-10 DIAGNOSIS — T148 Other injury of unspecified body region: Secondary | ICD-10-CM | POA: Diagnosis not present

## 2014-09-10 DIAGNOSIS — Z4800 Encounter for change or removal of nonsurgical wound dressing: Secondary | ICD-10-CM | POA: Diagnosis not present

## 2014-09-10 DIAGNOSIS — S51802A Unspecified open wound of left forearm, initial encounter: Secondary | ICD-10-CM | POA: Diagnosis not present

## 2014-09-10 DIAGNOSIS — S51812A Laceration without foreign body of left forearm, initial encounter: Secondary | ICD-10-CM | POA: Diagnosis not present

## 2014-09-17 DIAGNOSIS — B353 Tinea pedis: Secondary | ICD-10-CM | POA: Diagnosis not present

## 2014-10-08 DIAGNOSIS — I350 Nonrheumatic aortic (valve) stenosis: Secondary | ICD-10-CM | POA: Diagnosis not present

## 2014-10-08 DIAGNOSIS — I2510 Atherosclerotic heart disease of native coronary artery without angina pectoris: Secondary | ICD-10-CM | POA: Diagnosis not present

## 2014-10-08 DIAGNOSIS — I10 Essential (primary) hypertension: Secondary | ICD-10-CM | POA: Diagnosis not present

## 2014-10-08 DIAGNOSIS — N185 Chronic kidney disease, stage 5: Secondary | ICD-10-CM | POA: Diagnosis not present

## 2014-11-02 NOTE — Progress Notes (Signed)
Cardiology Office Note   Date:  11/03/2014   ID:  Shawn Long, DOB January 11, 1923, MRN [id number]  PCP:  [name], MD  Cardiologist:  Sinclair Grooms, MD   Chief Complaint  Patient presents with  . Cardiac Valve Problem  . Coronary Artery Disease      History of Present Illness: Shawn Long is a 81 y.o. male who presents for hypertension with chronic kidney disease and heart failure, severe aortic stenosis, CAD with prior bypass grafting and stable angina, stage IV chronic kidney disease, and hyperlipidemia.  The patient notices definite reduction in exertional tolerance. He fatigues much more easily now than he did 6 months or a year ago. He doesn't complain that it is shortness of breath but rather muscles feel weak and he gets tired. He has to stop and rest. He has not had syncope. He denies orthopnea and PND. No transient neurological complaints. No prolonged palpitations have occurred.   Past Medical History  Diagnosis Date  . HTN (hypertension)   . Hyperlipidemia   . Severe aortic stenosis   . CAD (coronary artery disease) 1998    s/p cabg x4  . Carotid arterial disease   . Chronic kidney disease (CKD), stage IV (severe)   . Aortic valve disorders   . Heart murmur   . Macular degeneration     Past Surgical History  Procedure Laterality Date  . Carotid endarterectomy, left  03/11/1997    Dr. Kellie Simmering  . Left knee arthroscopic sugery    . Right pointer finger surgery secondary to industrial accident    . Coronary artery bypass graft  01/15/97    CABGx4 by Dr Redmond Pulling (LIMA to diag + LAD, SVG to OM1 + OM2)     Current Outpatient Prescriptions  Medication Sig Dispense Refill  . aspirin 81 MG tablet Take 81 mg by mouth daily.      . brinzolamide (AZOPT) 1 % ophthalmic suspension Place 1 drop into both eyes 2 (two) times daily.     . furosemide (LASIX) 20 MG tablet Take 20 mg by mouth daily.     Marland Kitchen ketoconazole (NIZORAL) 2 % cream Apply 1 application  topically 2 (two) times daily. (FEET)  2  . latanoprost (XALATAN) 0.005 % ophthalmic solution Place 1 drop into both eyes at bedtime.     . metoprolol (TOPROL-XL) 50 MG 24 hr tablet Take 50 mg by mouth daily.      . Misc Natural Products (OSTEO BI-FLEX ADV JOINT SHIELD PO) Take 1 tablet by mouth daily.     . Multiple Vitamins-Minerals (MACUVITE EYE CARE) TABS Take 1 tablet by mouth QID.      Marland Kitchen simvastatin (ZOCOR) 40 MG tablet Take 40 mg by mouth daily at 6 PM.      No current facility-administered medications for this visit.    Allergies:   Penicillins    Social History:  The patient  reports that he has never smoked. He has never used smokeless tobacco. He reports that he drinks alcohol. He reports that he does not use illicit drugs.   Family History:  The patient's family history includes Cancer - Ovarian in his mother.    ROS:  Please see the history of present illness.   Otherwise, review of systems are positive for easy bruising. No falls or syncope. Severe chronic kidney disease and also followed by nephrology. No dialysis to this point..   All other systems are reviewed and negative.  PHYSICAL EXAM: VS:  BP 140/70 mmHg  Pulse 83  Ht 5\' 6"  (1.676 m)  Wt 77.021 kg (169 lb 12.8 oz)  BMI 27.42 kg/m2  SpO2 94% , BMI Body mass index is 27.42 kg/(m^2). GEN: Well nourished, well developed, in no acute distress HEENT: normal Neck: no JVD, carotid bruits, or masses Cardiac: RRR; no murmurs, rubs, or gallops,no edema  Respiratory:  clear to auscultation bilaterally, normal work of breathing GI: soft, nontender, nondistended, + BS MS: no deformity or atrophy Skin: warm and dry, no rash Neuro:  Strength and sensation are intact Psych: euthymic mood, full affect   EKG:  EKG is not ordered today.    Recent Labs: No results found for requested labs within last 365 days.    Lipid Panel No results found for: CHOL, TRIG, HDL, CHOLHDL, VLDL, LDLCALC, LDLDIRECT    Wt Readings  from Last 3 Encounters:  11/03/14 77.021 kg (169 lb 12.8 oz)  04/10/14 78.835 kg (173 lb 12.8 oz)  09/15/13 77.111 kg (170 lb)      Other studies Reviewed: Additional studies/ records that were reviewed today include: Would try to obtain laboratory data from his nephrologist in [place], Dr ?.    ASSESSMENT AND PLAN:  1. Severe aortic stenosis Critical aortic stenosis, symptomatic. We talked about proceeding with evaluation for valve therapy. He would likely develop dialysis dependent kidney failure with any contrast exposure. His goal is to avoid dialysis if at all possible and does not want to pursue any evaluation that would "upset the apple cart".  Therefore, we agreed that he is having symptomatic aortic stenosis. This worsens his prognosis. He feels that at his age, the only time to do anything about the aortic valve would be if he is already on dialysis therapy. Therefore we will forego any evaluation at this time.  2. Hyperlipidemia On therapy and followed by his primary care physician  3. Essential hypertension Controlled but in the setting of critical aortic stenosis.  4. Coronary artery disease with unspecified angina pectoris Has not had any episodes of angina on the current medical regimen  5. Chronic kidney disease (CKD), stage IV (severe) Stage IV chronic kidney disease followed by nephrology in [facility]    Current medicines are reviewed at length with the patient today.  The patient does not have concerns regarding medicines.  The following changes have been made:  At the patient's request, no evaluation for aortic valve therapy will be undertaken because of the high likelihood of making kidney function worsened possibly requiring dialysis. I did make it clear to the patient that his increasing fatigue is at least in part related to aortic stenosis. He understands that the optimal time to treat aortic stenosis is prior to an acute  cardiovascular emergency such as acute pulmonary edema or shock.  Labs/ tests ordered today include:  No orders of the defined types were placed in this encounter.     Disposition:   FU with HS in 6 months  Signed, Sinclair Grooms, MD  11/03/2014 1:45 PM    [facility] [address], [place], [place]  [address] Phone: [phone]; Fax: [phone]

## 2014-11-03 ENCOUNTER — Encounter: Payer: Self-pay | Admitting: Interventional Cardiology

## 2014-11-03 ENCOUNTER — Ambulatory Visit (INDEPENDENT_AMBULATORY_CARE_PROVIDER_SITE_OTHER): Payer: Medicare Other | Admitting: Interventional Cardiology

## 2014-11-03 VITALS — BP 140/70 | HR 83 | Ht 66.0 in | Wt 169.8 lb

## 2014-11-03 DIAGNOSIS — E785 Hyperlipidemia, unspecified: Secondary | ICD-10-CM | POA: Diagnosis not present

## 2014-11-03 DIAGNOSIS — I25119 Atherosclerotic heart disease of native coronary artery with unspecified angina pectoris: Secondary | ICD-10-CM

## 2014-11-03 DIAGNOSIS — I350 Nonrheumatic aortic (valve) stenosis: Secondary | ICD-10-CM

## 2014-11-03 DIAGNOSIS — I25810 Atherosclerosis of coronary artery bypass graft(s) without angina pectoris: Secondary | ICD-10-CM | POA: Diagnosis not present

## 2014-11-03 DIAGNOSIS — I10 Essential (primary) hypertension: Secondary | ICD-10-CM | POA: Diagnosis not present

## 2014-11-03 DIAGNOSIS — N184 Chronic kidney disease, stage 4 (severe): Secondary | ICD-10-CM

## 2014-11-03 NOTE — Patient Instructions (Signed)
Medication Instructions:  Your physician recommends that you continue on your current medications as directed. Please refer to the Current Medication list given to you today.   Labwork: None ordered  Testing/Procedures: None ordered  Follow-Up: Your physician wants you to follow-up in: 6-8 months with Dr.Smith You will receive a reminder letter in the mail two months in advance. If you don't receive a letter, please call our office to schedule the follow-up appointment.   Any Other Special Instructions Will Be Listed Below (If Applicable).

## 2014-11-18 DIAGNOSIS — Z23 Encounter for immunization: Secondary | ICD-10-CM | POA: Diagnosis not present

## 2014-11-19 DIAGNOSIS — H40053 Ocular hypertension, bilateral: Secondary | ICD-10-CM | POA: Diagnosis not present

## 2014-11-19 DIAGNOSIS — H4011X3 Primary open-angle glaucoma, severe stage: Secondary | ICD-10-CM | POA: Diagnosis not present

## 2014-11-19 DIAGNOSIS — H4011X1 Primary open-angle glaucoma, mild stage: Secondary | ICD-10-CM | POA: Diagnosis not present

## 2014-11-20 NOTE — Patient Outreach (Signed)
[facility]) Care Management  11/20/2014  Shawn Long [birth year] [id number]   Referral from [employer] Tier 2 List, assigned Jon Billings, RN to outreach.  Thanks, Ronnell Freshwater. [place], [place] Assistant Phone: [phone] Fax: [phone]

## 2014-11-23 ENCOUNTER — Other Ambulatory Visit: Payer: Self-pay

## 2014-11-23 NOTE — Patient Outreach (Signed)
[facility]) Care Management  11/23/2014  COLLIS THEDE Feb 15, 1923 [id number]   Telephone call to patient for referral from Tier 2 list. Spoke with patient he states that he is just about to go out and is not interested in services then hung up.  Plan: RN Health Coach will send [facility] Letter and brochure.   RN Health Coach will send Lurline Del in basket message to close case.  Jone Baseman, RN, MSN [place] [phone]

## 2014-11-25 NOTE — Patient Outreach (Signed)
[facility]) Care Management  11/25/2014  JUWANN SHERK [birth year] [id number]   Notification form Jon Billings, RN to close case due to patient refused [place] Management services.  Thanks, Ronnell Freshwater. [place], [place] Assistant Phone: [phone] Fax: [phone]

## 2014-12-13 ENCOUNTER — Emergency Department (HOSPITAL_COMMUNITY): Payer: Medicare Other

## 2014-12-13 ENCOUNTER — Encounter (HOSPITAL_COMMUNITY): Payer: Self-pay | Admitting: Emergency Medicine

## 2014-12-13 ENCOUNTER — Emergency Department (HOSPITAL_COMMUNITY)
Admission: EM | Admit: 2014-12-13 | Discharge: 2014-12-13 | Disposition: A | Discharge status: Home / Self Care | Payer: Medicare Other | Attending: Physician Assistant | Admitting: Physician Assistant

## 2014-12-13 DIAGNOSIS — Z79899 Other long term (current) drug therapy: Secondary | ICD-10-CM | POA: Insufficient documentation

## 2014-12-13 DIAGNOSIS — Z951 Presence of aortocoronary bypass graft: Secondary | ICD-10-CM | POA: Insufficient documentation

## 2014-12-13 DIAGNOSIS — Z880 Allergy status to penicillin: Secondary | ICD-10-CM | POA: Insufficient documentation

## 2014-12-13 DIAGNOSIS — R0789 Other chest pain: Secondary | ICD-10-CM | POA: Diagnosis not present

## 2014-12-13 DIAGNOSIS — Z8669 Personal history of other diseases of the nervous system and sense organs: Secondary | ICD-10-CM | POA: Insufficient documentation

## 2014-12-13 DIAGNOSIS — Z7982 Long term (current) use of aspirin: Secondary | ICD-10-CM | POA: Diagnosis not present

## 2014-12-13 DIAGNOSIS — R109 Unspecified abdominal pain: Secondary | ICD-10-CM | POA: Insufficient documentation

## 2014-12-13 DIAGNOSIS — I129 Hypertensive chronic kidney disease with stage 1 through stage 4 chronic kidney disease, or unspecified chronic kidney disease: Secondary | ICD-10-CM | POA: Diagnosis not present

## 2014-12-13 DIAGNOSIS — M549 Dorsalgia, unspecified: Secondary | ICD-10-CM | POA: Diagnosis not present

## 2014-12-13 DIAGNOSIS — K5790 Diverticulosis of intestine, part unspecified, without perforation or abscess without bleeding: Secondary | ICD-10-CM | POA: Diagnosis not present

## 2014-12-13 DIAGNOSIS — M545 Low back pain: Secondary | ICD-10-CM | POA: Diagnosis not present

## 2014-12-13 DIAGNOSIS — R011 Cardiac murmur, unspecified: Secondary | ICD-10-CM | POA: Diagnosis not present

## 2014-12-13 DIAGNOSIS — E785 Hyperlipidemia, unspecified: Secondary | ICD-10-CM | POA: Diagnosis not present

## 2014-12-13 DIAGNOSIS — N184 Chronic kidney disease, stage 4 (severe): Secondary | ICD-10-CM | POA: Diagnosis not present

## 2014-12-13 DIAGNOSIS — I2510 Atherosclerotic heart disease of native coronary artery without angina pectoris: Secondary | ICD-10-CM | POA: Diagnosis not present

## 2014-12-13 DIAGNOSIS — R079 Chest pain, unspecified: Secondary | ICD-10-CM | POA: Diagnosis not present

## 2014-12-13 LAB — COMPREHENSIVE METABOLIC PANEL
ALBUMIN: 4 g/dL (ref 3.5–5.0)
ALT: 19 U/L (ref 17–63)
AST: 26 U/L (ref 15–41)
Alkaline Phosphatase: 55 U/L (ref 38–126)
Anion gap: 10 (ref 5–15)
BUN: 68 mg/dL — ABNORMAL HIGH (ref 6–20)
CO2: 23 mmol/L (ref 22–32)
Calcium: 9.4 mg/dL (ref 8.9–10.3)
Chloride: 104 mmol/L (ref 101–111)
Creatinine, Ser: 3.09 mg/dL — ABNORMAL HIGH (ref 0.61–1.24)
GFR calc Af Amer: 19 mL/min — ABNORMAL LOW (ref >60)
GFR calc non Af Amer: 16 mL/min — ABNORMAL LOW (ref >60)
GLUCOSE: 113 mg/dL — AB (ref 65–99)
POTASSIUM: 4.1 mmol/L (ref 3.5–5.1)
SODIUM: 137 mmol/L (ref 135–145)
Total Bilirubin: 0.8 mg/dL (ref 0.3–1.2)
Total Protein: 7.3 g/dL (ref 6.5–8.1)

## 2014-12-13 LAB — CBC WITH DIFFERENTIAL/PLATELET
BASOS ABS: 0.1 10*3/uL (ref 0.0–0.1)
BASOS PCT: 1 %
Eosinophils Absolute: 0.2 10*3/uL (ref 0.0–0.7)
Eosinophils Relative: 3 %
HEMATOCRIT: 32.4 % — AB (ref 39.0–52.0)
HEMOGLOBIN: 10.7 g/dL — AB (ref 13.0–17.0)
LYMPHS PCT: 24 %
Lymphs Abs: 1.6 10*3/uL (ref 0.7–4.0)
MCH: 33.2 pg (ref 26.0–34.0)
MCHC: 33 g/dL (ref 30.0–36.0)
MCV: 100.6 fL — AB (ref 78.0–100.0)
MONO ABS: 0.6 10*3/uL (ref 0.1–1.0)
MONOS PCT: 9 %
NEUTROS ABS: 4.3 10*3/uL (ref 1.7–7.7)
NEUTROS PCT: 63 %
Platelets: 139 10*3/uL — ABNORMAL LOW (ref 150–400)
RBC: 3.22 MIL/uL — ABNORMAL LOW (ref 4.22–5.81)
RDW: 13.6 % (ref 11.5–15.5)
WBC: 6.9 10*3/uL (ref 4.0–10.5)

## 2014-12-13 LAB — URINALYSIS, ROUTINE W REFLEX MICROSCOPIC
Bilirubin Urine: NEGATIVE
Glucose, UA: NEGATIVE mg/dL
Hgb urine dipstick: NEGATIVE
KETONES UR: NEGATIVE mg/dL
LEUKOCYTES UA: NEGATIVE
NITRITE: NEGATIVE
PH: 6 (ref 5.0–8.0)
Protein, ur: NEGATIVE mg/dL
SPECIFIC GRAVITY, URINE: 1.013 (ref 1.005–1.030)
Urobilinogen, UA: 0.2 mg/dL (ref 0.0–1.0)

## 2014-12-13 MED ORDER — IOHEXOL 300 MG/ML  SOLN
50.0000 mL | Freq: Once | INTRAMUSCULAR | Status: AC | PRN
  Administered 2014-12-13: 50 mL via ORAL

## 2014-12-13 MED ORDER — IBUPROFEN 800 MG PO TABS
800.0000 mg | ORAL_TABLET | Freq: Once | ORAL | Status: AC
  Administered 2014-12-13: 800 mg via ORAL
  Filled 2014-12-13: qty 1

## 2014-12-13 MED ORDER — CYCLOBENZAPRINE HCL 10 MG PO TABS
10.0000 mg | ORAL_TABLET | Freq: Two times a day (BID) | ORAL | Status: DC | PRN
Start: 2014-12-13 — End: 2015-12-07

## 2014-12-13 MED ORDER — CYCLOBENZAPRINE HCL 10 MG PO TABS
10.0000 mg | ORAL_TABLET | Freq: Once | ORAL | Status: AC
  Administered 2014-12-13: 10 mg via ORAL
  Filled 2014-12-13: qty 1

## 2014-12-13 NOTE — ED Provider Notes (Signed)
CSN: [id number]     Arrival date & time 12/13/14  0719 History   First MD Initiated Contact with Patient 12/13/14 0725     Chief Complaint  Patient presents with  . Flank Pain     (Consider location/radiation/quality/duration/timing/severity/associated sxs/prior Treatment) HPI   Pt is a 81 year old male presenting with HTN HLD CAD, AS presenting with flank pain starting last night.  He has pain that is worse with movement in the right lateral lower ribs.  No fevers, no hematuria, no urinary symptoms.   Location is consistent with a kidney stone. However patient says it's worse with movement. Location is the right lateral back but overlying the ribs. Patient has absolutely no right upper quadrant tenderness.   Patient had no associated symptoms no nausea no vomiting or diarrhea. No fevers. No cough. No abdominal pain.   Past Medical History  Diagnosis Date  . HTN (hypertension)   . Hyperlipidemia   . Severe aortic stenosis   . CAD (coronary artery disease) 1998    s/p cabg x4  . Carotid arterial disease ([place])   . Chronic kidney disease (CKD), stage IV (severe) ([place])   . Aortic valve disorders   . Heart murmur   . Macular degeneration    Past Surgical History  Procedure Laterality Date  . Carotid endarterectomy, left  03/11/1997    Dr. Kellie Simmering  . Left knee arthroscopic sugery    . Right pointer finger surgery secondary to industrial accident    . Coronary artery bypass graft  01/15/97    CABGx4 by Dr Redmond Pulling (LIMA to diag + LAD, SVG to OM1 + OM2)   Family History  Problem Relation Age of Onset  . Cancer - Ovarian Mother    Social History  Substance Use Topics  . Smoking status: Never Smoker   . Smokeless tobacco: Never Used  . Alcohol Use: Yes     Comment: 4oz wine per day    Review of Systems  Constitutional: Negative for fever, activity change and fatigue.  HENT: Negative for congestion.   Eyes: Negative for discharge.  Respiratory: Negative for shortness of  breath.   Cardiovascular: Negative for chest pain.  Gastrointestinal: Negative for abdominal pain.  Genitourinary: Positive for flank pain. Negative for dysuria, hematuria, difficulty urinating and genital sores.  Musculoskeletal: Negative for back pain.  Skin: Negative for rash.  Neurological: Negative for dizziness.  Psychiatric/Behavioral: Negative for behavioral problems and agitation.      Allergies  Penicillins  Home Medications   Prior to Admission medications   Medication Sig Start Date End Date Taking? Authorizing Provider  aspirin 81 MG tablet Take 81 mg by mouth daily.      Historical Provider, MD  brinzolamide (AZOPT) 1 % ophthalmic suspension Place 1 drop into both eyes 2 (two) times daily.     Historical Provider, MD  furosemide (LASIX) 20 MG tablet Take 20 mg by mouth daily.  08/21/13   Historical Provider, MD  ketoconazole (NIZORAL) 2 % cream Apply 1 application topically 2 (two) times daily. (FEET) 10/22/14   Historical Provider, MD  latanoprost (XALATAN) 0.005 % ophthalmic solution Place 1 drop into both eyes at bedtime.     Historical Provider, MD  Misc Natural Products (OSTEO BI-FLEX ADV JOINT SHIELD PO) Take 1 tablet by mouth daily.     Historical Provider, MD  Multiple Vitamins-Minerals [facility] EYE CARE) TABS Take 1 tablet by mouth QID.      Historical Provider, MD  simvastatin (ZOCOR)  40 MG tablet Take 40 mg by mouth daily at 6 PM.  02/18/14   Historical Provider, MD   BP 146/81 mmHg  Pulse 72  Temp(Src) 97.5 F (36.4 C) (Oral)  Resp 16  SpO2 97% Physical Exam  Constitutional: He is oriented to person, place, and time. He appears well-nourished.  HENT:  Head: Normocephalic.  Mouth/Throat: Oropharynx is clear and moist.  Eyes: Conjunctivae are normal.  Neck: No tracheal deviation present.  Cardiovascular: Normal rate.   Pulmonary/Chest: Effort normal. No stridor. No respiratory distress. He exhibits tenderness.  Pt has tenderness to the lateral  flank/chest wall overlying high numbered ribs  Abdominal: Soft. There is no tenderness. There is no guarding.  No tenderness, no CVA tenderness.  Musculoskeletal: Normal range of motion. He exhibits no edema.  Neurological: He is oriented to person, place, and time. No cranial nerve deficit.  Skin: Skin is warm and dry. No rash noted. He is not diaphoretic.  Psychiatric: He has a normal mood and affect. His behavior is normal.  Nursing note and vitals reviewed.   ED Course  Procedures (including critical care time) Labs Review Labs Reviewed  URINE CULTURE  CBC WITH DIFFERENTIAL/PLATELET  COMPREHENSIVE METABOLIC PANEL  URINALYSIS, ROUTINE W REFLEX MICROSCOPIC (NOT AT [facility])    Imaging Review No results found. I have personally reviewed and evaluated these images and lab results as part of my medical decision-making.   EKG Interpretation None      MDM   Final diagnoses:  None    Patient is a 81 year old male with hypertension hyperlipidemia critical aortic stenosis, hard status post CABG presenting today with right flank pain. The patient complains of flank pain with pains really located on the right lateral lower ribs. It is tender to palpation. He says the pain is worse when moving like when he tries to get up out of bed to go to the bathroom the middle night. It is been going on since yesterday evening. Patient reports no change in exercise. Patient reports no symptoms of hematuria or urinary tract disease. Concern of kidney stone versus muscular skeletal pain. Do not think that there is intra-abdominal pathology given his soft abdomen exam. We'll get urine, CT noncontrast make sure there is no stone. If both labs and CT are normal. We'll have to assume that this is MSKl pain and treat accordingly.  The Flexeril help patient tremendously. Ambulatory and taking by mouth discharge.     Dijon Kohlman Julio Alm, MD 12/13/14 [phone]

## 2014-12-13 NOTE — ED Notes (Signed)
Pt ambulated to restroom by himself.

## 2014-12-13 NOTE — ED Notes (Signed)
Pt tolerating water with no difficulty

## 2014-12-13 NOTE — ED Notes (Signed)
Pt given OJ to work.

## 2014-12-13 NOTE — ED Notes (Signed)
Bed: DH68 Expected date: 12/13/14 Expected time: 7:17 AM Means of arrival: Ambulance Comments: Back pain

## 2014-12-13 NOTE — Discharge Instructions (Signed)
You came in today with flank pain. We could not find a good reason for her flank pain. It may be due to the muscles. Especially since it is made worse when you moves. Your hemoglobin was a little bit lower than we would anticipate today. Please follow-up this week with your primary care physician to get it rechecked.   Flank Pain Flank pain refers to pain that is located on the side of the body between the upper abdomen and the back. The pain may occur over a short period of time (acute) or may be long-term or reoccurring (chronic). It may be mild or severe. Flank pain can be caused by many things. CAUSES  Some of the more common causes of flank pain include:  Muscle strains.   Muscle spasms.   A disease of your spine (vertebral disk disease).   A lung infection (pneumonia).   Fluid around your lungs (pulmonary edema).   A kidney infection.   Kidney stones.   A very painful skin rash caused by the chickenpox virus (shingles).   Gallbladder disease.  [place] care will depend on the cause of your pain. In general,  Rest as directed by your caregiver.  Drink enough fluids to keep your urine clear or pale yellow.  Only take over-the-counter or prescription medicines as directed by your caregiver. Some medicines may help relieve the pain.  Tell your caregiver about any changes in your pain.  Follow up with your caregiver as directed. SEEK IMMEDIATE MEDICAL CARE IF:   Your pain is not controlled with medicine.   You have new or worsening symptoms.  Your pain increases.   You have abdominal pain.   You have shortness of breath.   You have persistent nausea or vomiting.   You have swelling in your abdomen.   You feel faint or pass out.   You have blood in your urine.  You have a fever or persistent symptoms for more than 2-3 days.  You have a fever and your symptoms suddenly get worse. MAKE SURE YOU:   Understand these  instructions.  Will watch your condition.  Will get help right away if you are not doing well or get worse.   This information is not intended to replace advice given to you by your health care provider. Make sure you discuss any questions you have with your health care provider.   Document Released: 04/06/2005 Document Revised: 11/08/2011 Document Reviewed: 09/28/2011 Elsevier Interactive Patient Education [employer].

## 2014-12-13 NOTE — ED Notes (Signed)
MD at bedside. 

## 2014-12-13 NOTE — ED Notes (Addendum)
Pt from home. Pt reports R lower back discomfort for some time. Last night pain became worse; describes pain as sharp. Pt denies dysuria or blood in urine. Pain worsened with movement.

## 2014-12-13 NOTE — ED Notes (Addendum)
MD at bedside. 

## 2014-12-14 LAB — URINE CULTURE: Culture: 7000

## 2014-12-17 DIAGNOSIS — R109 Unspecified abdominal pain: Secondary | ICD-10-CM | POA: Diagnosis not present

## 2014-12-29 DIAGNOSIS — Z08 Encounter for follow-up examination after completed treatment for malignant neoplasm: Secondary | ICD-10-CM | POA: Diagnosis not present

## 2014-12-29 DIAGNOSIS — L570 Actinic keratosis: Secondary | ICD-10-CM | POA: Diagnosis not present

## 2014-12-29 DIAGNOSIS — Z85820 Personal history of malignant melanoma of skin: Secondary | ICD-10-CM | POA: Diagnosis not present

## 2015-01-14 DIAGNOSIS — H4063X0 Glaucoma secondary to drugs, bilateral, stage unspecified: Secondary | ICD-10-CM | POA: Diagnosis not present

## 2015-01-14 DIAGNOSIS — H401123 Primary open-angle glaucoma, left eye, severe stage: Secondary | ICD-10-CM | POA: Diagnosis not present

## 2015-01-14 DIAGNOSIS — H401111 Primary open-angle glaucoma, right eye, mild stage: Secondary | ICD-10-CM | POA: Diagnosis not present

## 2015-02-11 DIAGNOSIS — I350 Nonrheumatic aortic (valve) stenosis: Secondary | ICD-10-CM | POA: Diagnosis not present

## 2015-02-11 DIAGNOSIS — N185 Chronic kidney disease, stage 5: Secondary | ICD-10-CM | POA: Diagnosis not present

## 2015-02-11 DIAGNOSIS — I2510 Atherosclerotic heart disease of native coronary artery without angina pectoris: Secondary | ICD-10-CM | POA: Diagnosis not present

## 2015-02-11 DIAGNOSIS — I10 Essential (primary) hypertension: Secondary | ICD-10-CM | POA: Diagnosis not present

## 2015-04-28 DIAGNOSIS — M7741 Metatarsalgia, right foot: Secondary | ICD-10-CM | POA: Diagnosis not present

## 2015-04-28 DIAGNOSIS — L859 Epidermal thickening, unspecified: Secondary | ICD-10-CM | POA: Diagnosis not present

## 2015-05-20 DIAGNOSIS — H353124 Nonexudative age-related macular degeneration, left eye, advanced atrophic with subfoveal involvement: Secondary | ICD-10-CM | POA: Diagnosis not present

## 2015-05-20 DIAGNOSIS — H353112 Nonexudative age-related macular degeneration, right eye, intermediate dry stage: Secondary | ICD-10-CM | POA: Diagnosis not present

## 2015-05-20 DIAGNOSIS — H353212 Exudative age-related macular degeneration, right eye, with inactive choroidal neovascularization: Secondary | ICD-10-CM | POA: Diagnosis not present

## 2015-05-20 DIAGNOSIS — H35053 Retinal neovascularization, unspecified, bilateral: Secondary | ICD-10-CM | POA: Diagnosis not present

## 2015-06-04 ENCOUNTER — Ambulatory Visit (INDEPENDENT_AMBULATORY_CARE_PROVIDER_SITE_OTHER): Payer: Medicare Other | Admitting: Interventional Cardiology

## 2015-06-04 ENCOUNTER — Encounter: Payer: Self-pay | Admitting: Interventional Cardiology

## 2015-06-04 VITALS — BP 130/74 | HR 72 | Ht 66.0 in | Wt 159.8 lb

## 2015-06-04 DIAGNOSIS — E785 Hyperlipidemia, unspecified: Secondary | ICD-10-CM

## 2015-06-04 DIAGNOSIS — I739 Peripheral vascular disease, unspecified: Secondary | ICD-10-CM

## 2015-06-04 DIAGNOSIS — I350 Nonrheumatic aortic (valve) stenosis: Secondary | ICD-10-CM

## 2015-06-04 DIAGNOSIS — N184 Chronic kidney disease, stage 4 (severe): Secondary | ICD-10-CM

## 2015-06-04 DIAGNOSIS — I779 Disorder of arteries and arterioles, unspecified: Secondary | ICD-10-CM

## 2015-06-04 DIAGNOSIS — I10 Essential (primary) hypertension: Secondary | ICD-10-CM

## 2015-06-04 NOTE — Patient Instructions (Signed)
Medication Instructions:  Your physician recommends that you continue on your current medications as directed. Please refer to the Current Medication list given to you today.   Labwork: None   Testing/Procedures: None   Follow-Up: Your physician wants you to follow-up in: 6 months with Dr [name]. (October 2017).  You will receive a reminder letter in the mail two months in advance. If you don't receive a letter, please call our office to schedule the follow-up appointment.       If you need a refill on your cardiac medications before your next appointment, please call your pharmacy.

## 2015-06-04 NOTE — Progress Notes (Signed)
Cardiology Office Note   Date:  06/04/2015   ID:  CAMDEN KOETH, DOB [birth year], MRN [id number]  PCP:  [name], MD  Cardiologist:  Sinclair Grooms, MD   Chief Complaint  Patient presents with  . Aortic Stenosis      History of Present Illness: Shawn Long is a 82 y.o. male who presents for Critical aortic stenosis. This is complicated by being elderly and frail, stage V chronic kidney disease, CAD, and hypertension.  [place] is doing particularly well considering his medical history. He would like to have aortic valve treatment if he ever becomes symptomatic related to the aortic stenosis. He has refused workup to be proactive concerning aortic valve treatment because of fears that any contrast exposure will lead to dialysis which he wants to avoid.  Luckily, he denies angina, dyspnea, and syncope. He has not noted lower extremity swelling. There is no change in his overall condition compared to 6 months ago. Last several echoes have demonstrated critical aortic stenosis.    Past Medical History  Diagnosis Date  . HTN (hypertension)   . Hyperlipidemia   . Severe aortic stenosis   . CAD (coronary artery disease) 1998    s/p cabg x4  . Carotid arterial disease ([place])   . Chronic kidney disease (CKD), stage IV (severe) ([place])   . Aortic valve disorders   . Heart murmur   . Macular degeneration     Past Surgical History  Procedure Laterality Date  . Carotid endarterectomy, left  03/11/1997    Dr. Kellie Simmering  . Left knee arthroscopic sugery    . Right pointer finger surgery secondary to industrial accident    . Coronary artery bypass graft  01/15/97    CABGx4 by Dr Redmond Pulling (LIMA to diag + LAD, SVG to OM1 + OM2)     Current Outpatient Prescriptions  Medication Sig Dispense Refill  . aspirin 81 MG tablet Take 81 mg by mouth daily.      . cyclobenzaprine (FLEXERIL) 10 MG tablet Take 1 tablet (10 mg total) by mouth 2 (two) times daily as needed for muscle spasms. 10  tablet 0  . furosemide (LASIX) 20 MG tablet Take 20 mg by mouth daily.     Marland Kitchen ketoconazole (NIZORAL) 2 % cream Apply 1 application topically 2 (two) times daily as needed for irritation. (FEET)  2  . latanoprost (XALATAN) 0.005 % ophthalmic solution Place 1 drop into both eyes at bedtime.     . metoprolol (LOPRESSOR) 50 MG tablet Take 50 mg by mouth every morning. Verified lopressor once daily    . Misc Natural Products (OSTEO BI-FLEX ADV JOINT SHIELD PO) Take 1 tablet by mouth daily.     . Multiple Vitamins-Minerals (MACUVITE EYE CARE) TABS Take 1 tablet by mouth QID.      Marland Kitchen SIMBRINZA 1-0.2 % SUSP Place 1 drop into both eyes 2 (two) times daily.    . simvastatin (ZOCOR) 20 MG tablet Take 20 mg by mouth daily at 6 PM.     No current facility-administered medications for this visit.    Allergies:   Penicillins    Social History:  The patient  reports that he has never smoked. He has never used smokeless tobacco. He reports that he drinks alcohol. He reports that he does not use illicit drugs.   Family History:  The patient's family history includes Cancer - Ovarian in his mother.    ROS:  Please see the history of present  illness.   Otherwise, review of systems are positive for Bruising.   All other systems are reviewed and negative.    PHYSICAL EXAM: VS:  BP 130/74 mmHg  Pulse 72  Ht 5\' 6"  (1.676 m)  Wt 159 lb 12.8 oz (72.485 kg)  BMI 25.80 kg/m2 , BMI Body mass index is 25.8 kg/(m^2). GEN: Well nourished, well developed, in no acute distress HEENT: normal Neck: no JVD, carotid bruits, or masses Cardiac: IRR.  There is no murmur, rub, or gallop. There is no edema. Respiratory:  clear to auscultation bilaterally, normal work of breathing. GI: soft, nontender, nondistended, + BS MS: no deformity or atrophy Skin: warm and dry, no rash Neuro:  Strength and sensation are intact Psych: euthymic mood, full affect   EKG:  EKG is ordered today. The ekg reveals normal sinus rhythm,  left ventricular hypertrophy with strain.   Recent Labs: 12/13/2014: ALT 19; BUN 68*; Creatinine, Ser 3.09*; Hemoglobin 10.7*; Platelets 139*; Potassium 4.1; Sodium 137    Lipid Panel No results found for: CHOL, TRIG, HDL, CHOLHDL, VLDL, LDLCALC, LDLDIRECT    Wt Readings from Last 3 Encounters:  06/04/15 159 lb 12.8 oz (72.485 kg)  11/03/14 169 lb 12.8 oz (77.021 kg)  04/10/14 173 lb 12.8 oz (78.835 kg)      Other studies Reviewed: Additional studies/ records that were reviewed today include: None. The findings include none.    ASSESSMENT AND PLAN:  1. Severe aortic stenosis We discussed potential treatment of the aortic valve. According to Mrs. severe LV was still be interested in TAVR but will not allow workup currently because he is concerned that contrast exposure will lead to dialysis. He does note that if he develops any symptoms he would then allow workup and will look forward to having [place] if possible.  2. Chronic kidney disease (CKD), stage IV (severe) (H[facility]) Stable disease. Patient wishes to avoid dialysis. He has not excluded the possibility of going on dialysis when the time comes.  3. Bilateral carotid artery disease ([place]) Asymptomatic  4. Essential hypertension Controlled blood in the setting of severe left ear  5. Hyperlipidemia Followed by his primary physician Dr. Christella Noa    Current medicines are reviewed at length with the patient today.  The patient has the following concerns regarding medicines: None.  The following changes/actions have been instituted:    Will follow-up in 6 months. I had performed an echocardiogram in a while because his left ear has been critical for quite some time. At his insistence, we will simply await symptom development before any further evaluation.  He is to call if angina, dyspnea, or syncope.  Labs/ tests ordered today include:  No orders of the defined types were placed in this encounter.     Disposition:    FU with HS in 6 months  Signed, Sinclair Grooms, MD  06/04/2015 10:03 AM    [facility] [address], [place], [place]  [address] Phone: [phone]; Fax: [phone]

## 2015-06-17 DIAGNOSIS — I2510 Atherosclerotic heart disease of native coronary artery without angina pectoris: Secondary | ICD-10-CM | POA: Diagnosis not present

## 2015-06-17 DIAGNOSIS — I10 Essential (primary) hypertension: Secondary | ICD-10-CM | POA: Diagnosis not present

## 2015-06-17 DIAGNOSIS — I350 Nonrheumatic aortic (valve) stenosis: Secondary | ICD-10-CM | POA: Diagnosis not present

## 2015-06-17 DIAGNOSIS — N185 Chronic kidney disease, stage 5: Secondary | ICD-10-CM | POA: Diagnosis not present

## 2015-06-24 DIAGNOSIS — H401123 Primary open-angle glaucoma, left eye, severe stage: Secondary | ICD-10-CM | POA: Diagnosis not present

## 2015-06-24 DIAGNOSIS — H353222 Exudative age-related macular degeneration, left eye, with inactive choroidal neovascularization: Secondary | ICD-10-CM | POA: Diagnosis not present

## 2015-06-24 DIAGNOSIS — H401111 Primary open-angle glaucoma, right eye, mild stage: Secondary | ICD-10-CM | POA: Diagnosis not present

## 2015-06-24 DIAGNOSIS — H353212 Exudative age-related macular degeneration, right eye, with inactive choroidal neovascularization: Secondary | ICD-10-CM | POA: Diagnosis not present

## 2015-06-29 DIAGNOSIS — L570 Actinic keratosis: Secondary | ICD-10-CM | POA: Diagnosis not present

## 2015-06-29 DIAGNOSIS — Z08 Encounter for follow-up examination after completed treatment for malignant neoplasm: Secondary | ICD-10-CM | POA: Diagnosis not present

## 2015-06-29 DIAGNOSIS — Z85820 Personal history of malignant melanoma of skin: Secondary | ICD-10-CM | POA: Diagnosis not present

## 2015-09-19 DIAGNOSIS — S51802A Unspecified open wound of left forearm, initial encounter: Secondary | ICD-10-CM | POA: Diagnosis not present

## 2015-09-19 DIAGNOSIS — S50812A Abrasion of left forearm, initial encounter: Secondary | ICD-10-CM | POA: Diagnosis not present

## 2015-11-17 DIAGNOSIS — Z23 Encounter for immunization: Secondary | ICD-10-CM | POA: Diagnosis not present

## 2015-11-19 ENCOUNTER — Encounter: Payer: Self-pay | Admitting: Interventional Cardiology

## 2015-12-07 ENCOUNTER — Encounter: Payer: Self-pay | Admitting: Interventional Cardiology

## 2015-12-07 ENCOUNTER — Ambulatory Visit (INDEPENDENT_AMBULATORY_CARE_PROVIDER_SITE_OTHER): Payer: Medicare Other | Admitting: Interventional Cardiology

## 2015-12-07 ENCOUNTER — Encounter (INDEPENDENT_AMBULATORY_CARE_PROVIDER_SITE_OTHER): Payer: Self-pay

## 2015-12-07 VITALS — BP 116/66 | HR 90 | Ht 66.0 in | Wt 163.4 lb

## 2015-12-07 DIAGNOSIS — I10 Essential (primary) hypertension: Secondary | ICD-10-CM | POA: Diagnosis not present

## 2015-12-07 DIAGNOSIS — E7849 Other hyperlipidemia: Secondary | ICD-10-CM

## 2015-12-07 DIAGNOSIS — I779 Disorder of arteries and arterioles, unspecified: Secondary | ICD-10-CM

## 2015-12-07 DIAGNOSIS — I350 Nonrheumatic aortic (valve) stenosis: Secondary | ICD-10-CM

## 2015-12-07 DIAGNOSIS — E784 Other hyperlipidemia: Secondary | ICD-10-CM | POA: Diagnosis not present

## 2015-12-07 DIAGNOSIS — I25810 Atherosclerosis of coronary artery bypass graft(s) without angina pectoris: Secondary | ICD-10-CM | POA: Diagnosis not present

## 2015-12-07 DIAGNOSIS — I739 Peripheral vascular disease, unspecified: Secondary | ICD-10-CM

## 2015-12-07 NOTE — Progress Notes (Signed)
Cardiology Office Note    Date:  12/07/2015   ID:  Shawn Long, DOB April 11, 1922, MRN [id number]  PCP:  [name], MD  Cardiologist: Sinclair Grooms, MD   Chief Complaint  Patient presents with  . Cardiac Valve Problem    History of Present Illness:  Shawn Long is a 81 y.o. male who presents for f/u of critical aortic stenosis. This is complicated by being elderly and frail, stage V chronic kidney disease, CAD with prior CABG ([place] LAD/Diag and SVG OM 1-2), and hypertension.  He is beginning to experience exertional fatigue. He denies chest pain. No orthopnea or PND. SAVR has been previously considered and turned down by the patient has a procedure that he would ever consider. He continues to ask about TAVR. Last echo was 2-1/2 years ago.  States that kidney function has been stable. He sees nephrology in [place] within the next month. No recent laboratory data is available.  Past Medical History:  Diagnosis Date  . Aortic valve disorders   . CAD (coronary artery disease) 1998   s/p cabg x4  . Carotid arterial disease ([place])   . Chronic kidney disease (CKD), stage IV (severe) ([place])   . Heart murmur   . HTN (hypertension)   . Hyperlipidemia   . Macular degeneration   . Severe aortic stenosis     Past Surgical History:  Procedure Laterality Date  . CAROTID ENDARTERECTOMY, LEFT  03/11/1997   Dr. Kellie Simmering  . CORONARY ARTERY BYPASS GRAFT  01/15/97   CABGx4 by Dr Redmond Pulling (LIMA to diag + LAD, SVG to OM1 + OM2)  . left knee arthroscopic sugery    . right pointer finger surgery secondary to industrial accident      Current Medications: Outpatient Medications Prior to Visit  Medication Sig Dispense Refill  . aspirin 81 MG tablet Take 81 mg by mouth daily.      . furosemide (LASIX) 20 MG tablet Take 20 mg by mouth daily.     Marland Kitchen latanoprost (XALATAN) 0.005 % ophthalmic solution Place 1 drop into both eyes at bedtime.     . metoprolol (LOPRESSOR) 50 MG tablet Take  50 mg by mouth every morning. Verified lopressor once daily    . Misc Natural Products (OSTEO BI-FLEX ADV JOINT SHIELD PO) Take 1 tablet by mouth daily.     . Multiple Vitamins-Minerals (MACUVITE EYE CARE) TABS Take 1 tablet by mouth QID.      Marland Kitchen SIMBRINZA 1-0.2 % SUSP Place 1 drop into both eyes 2 (two) times daily.    . simvastatin (ZOCOR) 20 MG tablet Take 20 mg by mouth daily at 6 PM.    . cyclobenzaprine (FLEXERIL) 10 MG tablet Take 1 tablet (10 mg total) by mouth 2 (two) times daily as needed for muscle spasms. 10 tablet 0  . ketoconazole (NIZORAL) 2 % cream Apply 1 application topically 2 (two) times daily as needed for irritation. (FEET)  2   No facility-administered medications prior to visit.      Allergies:   Penicillins   Social History   Social History  . Marital status: Married    Spouse name: N/A  . Number of children: 3  . Years of education: N/A   Occupational History  . unemployed, retired     Social History Main Topics  . Smoking status: Never Smoker  . Smokeless tobacco: Never Used  . Alcohol use Yes     Comment: 4oz wine per day  .  Drug use: No  . Sexual activity: Not Asked   Other Topics Concern  . None   Social History Narrative  . None     Family History:  The patient's Irrelevant Mother had ovarian cancer which was a cause of her death. Old age was a cause of his father's death.   ROS:   Please see the history of present illness.    Back pain, easy bruising.  All other systems reviewed and are negative.   PHYSICAL EXAM:   VS:  BP 116/66   Pulse 90   Ht 5\' 6"  (1.676 m)   Wt 163 lb 6.4 oz (74.1 kg)   BMI 26.37 kg/m    GEN: Well nourished, well developed, in no acute distress  HEENT: normal  Neck: no JVD, or masses. Faint bilateral transmission of aortic stenosis murmur to the carotids. Marked reduction in carotid upstroke. Cardiac: RRR; No rub or gallop. High-pitched late crescendo systolic murmur compatible with aortic stenosis. No  diastolic murmurs heard.,no edema  Respiratory:  clear to auscultation bilaterally, normal work of breathing GI: soft, nontender, nondistended, + BS MS: no deformity or atrophy  Skin: warm and dry, no rash Neuro:  Alert and Oriented x 3, Strength and sensation are intact Psych: euthymic mood, full affect  Wt Readings from Last 3 Encounters:  12/07/15 163 lb 6.4 oz (74.1 kg)  06/04/15 159 lb 12.8 oz (72.5 kg)  11/03/14 169 lb 12.8 oz (77 kg)      Studies/Labs Reviewed:   EKG:  EKG - not performed.  Recent Labs: 12/13/2014: ALT 19; BUN 68; Creatinine, Ser 3.09; Hemoglobin 10.7; Platelets 139; Potassium 4.1; Sodium 137   Lipid Panel No results found for: CHOL, TRIG, HDL, CHOLHDL, VLDL, LDLCALC, LDLDIRECT  Additional studies/ records that were reviewed today include:  In July 2015 peak velocity through the aortic valve was 402 ms. We have not repeated the study because our approach has been one of observation and concerned that any exposure to contrast would lead to kidney injury. He has been reluctant to proceed with any invasive evaluation.  Last creatinine on file was 3.09 in October 2016.    ASSESSMENT:    1. Severe aortic stenosis   2. Essential hypertension   3. Coronary artery disease involving coronary bypass graft of native heart without angina pectoris   4. Other hyperlipidemia   5. Bilateral carotid artery disease ([place])      PLAN:  In order of problems listed above:  1. Clinically, aortic stenosis is critical with diminished carotid upstroke, high-pitched late peaking murmur of aortic stenosis, and now complaints of exertional fatigue. He has not had syncope or angina. There's no volume excess. We will repeat his echocardiogram to rule out LV dysfunction. This may help [place] to make further decisions about management. He would be willing to consider TAVR. 2. Blood pressure is now relatively low, related to progression of aortic stenosis. We may eventually need to  wean his antihypertensive therapy to avoid syncope. 3. Asymptomatic with reference to angina. Cardiac bypass grafting was 1998. Had a LIMA sequential to the LAD diagonal and sequential saphenous vein graft to the obtuse marginal 1 and 2. 4. Lipids are not followed by cardiology. 5. Carotids have not been followed generally. Most 69 week year-old exam is related to transmission of the aortic stenosis murmur.    Medication Adjustments/Labs and Tests Ordered: Current medicines are reviewed at length with the patient today.  Concerns regarding medicines are outlined above.  Medication  changes, Labs and Tests ordered today are listed in the Patient Instructions below. Patient Instructions  Medication Instructions:  None  Labwork: None  Testing/Procedures: Your physician has requested that you have an echocardiogram. Echocardiography is a painless test that uses sound waves to create images of your heart. It provides your doctor with information about the size and shape of your heart and how well your heart's chambers and valves are working. This procedure takes approximately one hour. There are no restrictions for this procedure.   Follow-Up: Your physician wants you to follow-up in: 6 months with Dr. [name].  You will receive a reminder letter in the mail two months in advance. If you don't receive a letter, please call our office to schedule the follow-up appointment.   Any Other Special Instructions Will Be Listed Below (If Applicable).     If you need a refill on your cardiac medications before your next appointment, please call your pharmacy.      Signed, Sinclair Grooms, MD  12/07/2015 1:55 PM    [facility] [place], [place], [place]  [address] Phone: [phone]; Fax: [phone]

## 2015-12-07 NOTE — Patient Instructions (Signed)
Medication Instructions:  None  Labwork: None  Testing/Procedures: Your physician has requested that you have an echocardiogram. Echocardiography is a painless test that uses sound waves to create images of your heart. It provides your doctor with information about the size and shape of your heart and how well your heart's chambers and valves are working. This procedure takes approximately one hour. There are no restrictions for this procedure.   Follow-Up: Your physician wants you to follow-up in: 6 months with Dr. [name].  You will receive a reminder letter in the mail two months in advance. If you don't receive a letter, please call our office to schedule the follow-up appointment.   Any Other Special Instructions Will Be Listed Below (If Applicable).     If you need a refill on your cardiac medications before your next appointment, please call your pharmacy.

## 2015-12-21 DIAGNOSIS — H401111 Primary open-angle glaucoma, right eye, mild stage: Secondary | ICD-10-CM | POA: Diagnosis not present

## 2015-12-21 DIAGNOSIS — H401123 Primary open-angle glaucoma, left eye, severe stage: Secondary | ICD-10-CM | POA: Diagnosis not present

## 2015-12-23 DIAGNOSIS — N185 Chronic kidney disease, stage 5: Secondary | ICD-10-CM | POA: Diagnosis not present

## 2015-12-23 DIAGNOSIS — I350 Nonrheumatic aortic (valve) stenosis: Secondary | ICD-10-CM | POA: Diagnosis not present

## 2015-12-23 DIAGNOSIS — I2510 Atherosclerotic heart disease of native coronary artery without angina pectoris: Secondary | ICD-10-CM | POA: Diagnosis not present

## 2015-12-23 DIAGNOSIS — I10 Essential (primary) hypertension: Secondary | ICD-10-CM | POA: Diagnosis not present

## 2016-01-04 ENCOUNTER — Ambulatory Visit (HOSPITAL_COMMUNITY): Payer: Medicare Other | Attending: Cardiology

## 2016-01-04 ENCOUNTER — Encounter (HOSPITAL_COMMUNITY): Payer: Self-pay | Admitting: Radiology

## 2016-01-04 ENCOUNTER — Telehealth: Payer: Self-pay | Admitting: Interventional Cardiology

## 2016-01-04 ENCOUNTER — Other Ambulatory Visit: Payer: Self-pay

## 2016-01-04 DIAGNOSIS — D485 Neoplasm of uncertain behavior of skin: Secondary | ICD-10-CM | POA: Diagnosis not present

## 2016-01-04 DIAGNOSIS — Z951 Presence of aortocoronary bypass graft: Secondary | ICD-10-CM | POA: Insufficient documentation

## 2016-01-04 DIAGNOSIS — I350 Nonrheumatic aortic (valve) stenosis: Secondary | ICD-10-CM

## 2016-01-04 DIAGNOSIS — Z08 Encounter for follow-up examination after completed treatment for malignant neoplasm: Secondary | ICD-10-CM | POA: Diagnosis not present

## 2016-01-04 DIAGNOSIS — R0602 Shortness of breath: Secondary | ICD-10-CM

## 2016-01-04 DIAGNOSIS — I272 Pulmonary hypertension, unspecified: Secondary | ICD-10-CM | POA: Insufficient documentation

## 2016-01-04 DIAGNOSIS — L570 Actinic keratosis: Secondary | ICD-10-CM | POA: Diagnosis not present

## 2016-01-04 DIAGNOSIS — Z85828 Personal history of other malignant neoplasm of skin: Secondary | ICD-10-CM | POA: Diagnosis not present

## 2016-01-04 DIAGNOSIS — C4361 Malignant melanoma of right upper limb, including shoulder: Secondary | ICD-10-CM | POA: Diagnosis not present

## 2016-01-04 LAB — ECHOCARDIOGRAM COMPLETE
AOVTI: 93.2 cm
AV area mean vel ind: 0.24 cm2/m2
AVA: 0.46 cm2
AVAREAMEANV: 0.45 cm2
AVAREAVTI: 0.46 cm2
AVAREAVTIIND: 0.25 cm2/m2
AVCELMEANRAT: 0.13
AVG: 41 mmHg
AVPG: 61 mmHg
AVPKVEL: 391 cm/s
Ao pk vel: 0.13 m/s
CHL CUP AV PEAK INDEX: 0.25
CHL CUP AV VALUE AREA INDEX: 0.25
CHL CUP AV VEL: 0.46
CHL CUP DOP CALC LVOT VTI: 12.4 cm
CHL CUP LVOT MV VTI: 1.84
CHL CUP MV DEC (S): 185
CHL CUP TV REG PEAK VELOCITY: 315 cm/s
DOP CAL AO MEAN VELOCITY: 303 cm/s
EERAT: 19.82
EWDT: 185 ms
FS: 5 % — AB (ref 28–44)
IV/PV OW: 1.1
LA diam end sys: 44 mm
LA diam index: 2.39 cm/m2
LA vol A4C: 66 ml
LA vol: 68.6 mL
LASIZE: 44 mm
LAVOLIN: 37.3 mL/m2
LV E/e' medial: 19.82
LV E/e'average: 19.82
LV PW d: 10.3 mm — AB (ref 0.6–1.1)
LV TDI E'LATERAL: 5.55
LV e' LATERAL: 5.55 cm/s
LVOT MV VTI INDEX: 1 cm2/m2
LVOT area: 3.46 cm2
LVOT peak VTI: 0.13 cm
LVOT peak vel: 51.7 cm/s
LVOTD: 21 mm
LVOTSV: 43 mL
MV Annulus VTI: 23.3 cm
MV M vel: 93.6
MV Peak grad: 5 mmHg
MV pk A vel: 107 m/s
MV pk E vel: 110 m/s
MVAP: 4.49 cm2
MVG: 4 mmHg
P 1/2 time: 49 ms
RV sys press: 43 mmHg
TAPSE: 10.7 mm
TR max vel: 315 cm/s

## 2016-01-04 NOTE — Telephone Encounter (Signed)
Left message to call back  

## 2016-01-04 NOTE — Telephone Encounter (Signed)
Spoke with pt and he states that he is now ready to proceed with TAVR if Dr. [name] still feels like he is a candidate.  Advised pt that I will send message to Dr. [name] for review and will call with recommendations.  Pt verbalized understanding.

## 2016-01-04 NOTE — CV Procedure (Signed)
Patient was in to have echocardiogram and noted Dr. Zigmund Gottron, his nephrologist, would like to be informed of the results due his kidney failure

## 2016-01-04 NOTE — Telephone Encounter (Signed)
Want to discuss having the valve replacement surgery. Had Echo today.

## 2016-01-06 NOTE — Telephone Encounter (Signed)
Have Mr. Janelle get a CBC, BNP, comprehensive metabolic panel, and PA and lateral chest x-ray. Diagnosis severe aortic stenosis.  Lead him know I cannot guarantee daily he will be a candidate for TAVR, but we can begin the process.  After blood work and x-ray is performed, referred to Dr. Julianne Handler or Burt Knack for consideration of TAVR.

## 2016-01-06 NOTE — Telephone Encounter (Signed)
Spoke with pt and informed him of recommendations per Dr. [name].  Pt verbalized understanding and was in agreement with this plan.  Pt plans to come tomorrow for labs and go to [place] for xray.  Advised pt once results were in I would call him back and we could move forward.  Pt very appreciative for assistance.

## 2016-01-06 NOTE — Telephone Encounter (Signed)
Left message to call back  

## 2016-01-06 NOTE — Telephone Encounter (Signed)
Follow Up:; ° ° °Returning your call. °

## 2016-01-07 ENCOUNTER — Ambulatory Visit
Admission: RE | Admit: 2016-01-07 | Discharge: 2016-01-07 | Disposition: A | Discharge status: Home / Self Care | Payer: Medicare Other | Source: Ambulatory Visit | Attending: Interventional Cardiology | Admitting: Interventional Cardiology

## 2016-01-07 ENCOUNTER — Other Ambulatory Visit: Payer: Medicare Other | Admitting: *Deleted

## 2016-01-07 DIAGNOSIS — R0602 Shortness of breath: Secondary | ICD-10-CM | POA: Diagnosis not present

## 2016-01-07 DIAGNOSIS — I350 Nonrheumatic aortic (valve) stenosis: Secondary | ICD-10-CM | POA: Diagnosis not present

## 2016-01-07 LAB — COMPREHENSIVE METABOLIC PANEL
ALBUMIN: 3.6 g/dL (ref 3.6–5.1)
ALT: 16 U/L (ref 9–46)
AST: 24 U/L (ref 10–35)
Alkaline Phosphatase: 64 U/L (ref 40–115)
BILIRUBIN TOTAL: 0.6 mg/dL (ref 0.2–1.2)
BUN: 50 mg/dL — ABNORMAL HIGH (ref 7–25)
CO2: 18 mmol/L — AB (ref 20–31)
CREATININE: 2.92 mg/dL — AB (ref 0.70–1.11)
Calcium: 8.9 mg/dL (ref 8.6–10.3)
Chloride: 106 mmol/L (ref 98–110)
GLUCOSE: 110 mg/dL — AB (ref 65–99)
Potassium: 4.5 mmol/L (ref 3.5–5.3)
SODIUM: 140 mmol/L (ref 135–146)
Total Protein: 6.8 g/dL (ref 6.1–8.1)

## 2016-01-07 LAB — CBC
HEMATOCRIT: 33.1 % — AB (ref 38.5–50.0)
Hemoglobin: 10.6 g/dL — ABNORMAL LOW (ref 13.2–17.1)
MCH: 34.2 pg — ABNORMAL HIGH (ref 27.0–33.0)
MCHC: 32 g/dL (ref 32.0–36.0)
MCV: 106.8 fL — ABNORMAL HIGH (ref 80.0–100.0)
MPV: 11.9 fL (ref 7.5–12.5)
PLATELETS: 196 10*3/uL (ref 140–400)
RBC: 3.1 MIL/uL — AB (ref 4.20–5.80)
RDW: 13.9 % (ref 11.0–15.0)
WBC: 8.8 10*3/uL (ref 3.8–10.8)

## 2016-01-09 DIAGNOSIS — Z08 Encounter for follow-up examination after completed treatment for malignant neoplasm: Secondary | ICD-10-CM | POA: Diagnosis not present

## 2016-01-09 DIAGNOSIS — C4361 Malignant melanoma of right upper limb, including shoulder: Secondary | ICD-10-CM | POA: Diagnosis not present

## 2016-01-09 DIAGNOSIS — Z85828 Personal history of other malignant neoplasm of skin: Secondary | ICD-10-CM | POA: Diagnosis not present

## 2016-01-09 LAB — BRAIN NATRIURETIC PEPTIDE: Brain Natriuretic Peptide: 2672.4 pg/mL — ABNORMAL HIGH (ref <100)

## 2016-01-10 ENCOUNTER — Telehealth: Payer: Self-pay | Admitting: Interventional Cardiology

## 2016-01-10 NOTE — Telephone Encounter (Signed)
Pt scheduled to see Dr Angelena Form on 01/19/16 for TAVR consult.

## 2016-01-10 NOTE — Telephone Encounter (Signed)
New message  Pt call requesting to speak with RN to f/u on information for procedure that is to be scheduled per pt. please call back to discuss

## 2016-01-10 NOTE — Telephone Encounter (Signed)
Pt calling to make sure we were aware that he had his chest xray and blood work on Friday.  Advised I am waiting for Dr. [name] to review labs.  Also, made pt aware of TAVR consult appt with Dr. Angelena Form on 11/22.  Pt verbalized understanding and was appreciative for assistance.

## 2016-01-13 ENCOUNTER — Telehealth: Payer: Self-pay | Admitting: Interventional Cardiology

## 2016-01-13 DIAGNOSIS — N184 Chronic kidney disease, stage 4 (severe): Secondary | ICD-10-CM

## 2016-01-13 NOTE — Telephone Encounter (Signed)
Dr. [name] spoke with pt directly.  Dr. [name] to speak with Dr. Welton Flakes.

## 2016-01-13 NOTE — Telephone Encounter (Signed)
Pt calling regarding upcoming appt to discuss TAVR , pls call

## 2016-01-13 NOTE — Telephone Encounter (Signed)
Dr. [name] spoke with Dr. Welton Flakes.  We will plan to have him set up with a nephrologist here in [place].  [facility] has contacted [facility] to get an urgent appointment.

## 2016-01-14 NOTE — Telephone Encounter (Signed)
I made several calls to [name]. On each occasion he has not answered the phone and a message was left.   Plan is to see a nephrologist in [place]. This will help with managing his kidney issues related to contrast exposure when heart catheterization and CT imaging is done.  After the nephrology consultation, we will then proceed with coronary angiography. This may be done with an overnight stay, with him being admitted the day prior to the procedure for hydration.  After the heart catheterization is performed, he will then be referred to the heart valve clinic/Dr. Angelena Form. At that point additional imaging studies with contrast will be needed.

## 2016-01-17 ENCOUNTER — Telehealth: Payer: Self-pay | Admitting: Cardiovascular Disease

## 2016-01-17 NOTE — Telephone Encounter (Signed)
Left message to call back  

## 2016-01-17 NOTE — Telephone Encounter (Signed)
Attempted to call pt back.  Phone busy x 2.  Will try again later.

## 2016-01-17 NOTE — Telephone Encounter (Signed)
New message   Pt verbalized that he is returning call for rn and that he confirmed the canceled appt

## 2016-01-18 NOTE — Telephone Encounter (Signed)
Pt returned call.  Pt is seeing Nephrologist at [facility] on 01/26/16.  Advised once he is seen there we will plan heart cath, then appt with Dr. Angelena Form.  Pt appreciative for all assistance.

## 2016-01-18 NOTE — Telephone Encounter (Signed)
Left message to call back  

## 2016-01-19 ENCOUNTER — Institutional Professional Consult (permissible substitution): Payer: Medicare Other | Admitting: Cardiovascular Disease

## 2016-01-21 ENCOUNTER — Emergency Department (HOSPITAL_COMMUNITY): Payer: Medicare Other

## 2016-01-21 ENCOUNTER — Telehealth: Payer: Self-pay | Admitting: Physician Assistant

## 2016-01-21 ENCOUNTER — Inpatient Hospital Stay (HOSPITAL_COMMUNITY)
Admission: EM | Admit: 2016-01-21 | Discharge: 2016-01-23 | DRG: 291 | Disposition: A | Discharge status: Home / Self Care | Payer: Medicare Other | Attending: Internal Medicine | Admitting: Internal Medicine

## 2016-01-21 ENCOUNTER — Encounter (HOSPITAL_COMMUNITY): Payer: Self-pay

## 2016-01-21 DIAGNOSIS — E785 Hyperlipidemia, unspecified: Secondary | ICD-10-CM | POA: Diagnosis present

## 2016-01-21 DIAGNOSIS — I350 Nonrheumatic aortic (valve) stenosis: Secondary | ICD-10-CM | POA: Diagnosis not present

## 2016-01-21 DIAGNOSIS — N184 Chronic kidney disease, stage 4 (severe): Secondary | ICD-10-CM | POA: Diagnosis not present

## 2016-01-21 DIAGNOSIS — I959 Hypotension, unspecified: Secondary | ICD-10-CM | POA: Diagnosis present

## 2016-01-21 DIAGNOSIS — Z79899 Other long term (current) drug therapy: Secondary | ICD-10-CM

## 2016-01-21 DIAGNOSIS — Z951 Presence of aortocoronary bypass graft: Secondary | ICD-10-CM

## 2016-01-21 DIAGNOSIS — I509 Heart failure, unspecified: Secondary | ICD-10-CM

## 2016-01-21 DIAGNOSIS — I130 Hypertensive heart and chronic kidney disease with heart failure and stage 1 through stage 4 chronic kidney disease, or unspecified chronic kidney disease: Secondary | ICD-10-CM | POA: Diagnosis not present

## 2016-01-21 DIAGNOSIS — I2510 Atherosclerotic heart disease of native coronary artery without angina pectoris: Secondary | ICD-10-CM | POA: Diagnosis present

## 2016-01-21 DIAGNOSIS — N179 Acute kidney failure, unspecified: Secondary | ICD-10-CM | POA: Diagnosis present

## 2016-01-21 DIAGNOSIS — I5043 Acute on chronic combined systolic (congestive) and diastolic (congestive) heart failure: Secondary | ICD-10-CM | POA: Diagnosis present

## 2016-01-21 DIAGNOSIS — Z7982 Long term (current) use of aspirin: Secondary | ICD-10-CM

## 2016-01-21 DIAGNOSIS — Z880 Allergy status to penicillin: Secondary | ICD-10-CM

## 2016-01-21 DIAGNOSIS — H3530 Unspecified macular degeneration: Secondary | ICD-10-CM | POA: Diagnosis present

## 2016-01-21 DIAGNOSIS — R0602 Shortness of breath: Secondary | ICD-10-CM | POA: Diagnosis not present

## 2016-01-21 LAB — BASIC METABOLIC PANEL
ANION GAP: 12 (ref 5–15)
BUN: 61 mg/dL — ABNORMAL HIGH (ref 6–20)
CALCIUM: 9.3 mg/dL (ref 8.9–10.3)
CO2: 24 mmol/L (ref 22–32)
Chloride: 105 mmol/L (ref 101–111)
Creatinine, Ser: 3.21 mg/dL — ABNORMAL HIGH (ref 0.61–1.24)
GFR calc non Af Amer: 16 mL/min — ABNORMAL LOW (ref >60)
GFR, EST AFRICAN AMERICAN: 18 mL/min — AB (ref >60)
GLUCOSE: 101 mg/dL — AB (ref 65–99)
POTASSIUM: 4.2 mmol/L (ref 3.5–5.1)
Sodium: 141 mmol/L (ref 135–145)

## 2016-01-21 LAB — CBC WITH DIFFERENTIAL/PLATELET
BASOS ABS: 0 10*3/uL (ref 0.0–0.1)
BASOS PCT: 1 %
Eosinophils Absolute: 0.2 10*3/uL (ref 0.0–0.7)
Eosinophils Relative: 2 %
HEMATOCRIT: 33.2 % — AB (ref 39.0–52.0)
HEMOGLOBIN: 10.8 g/dL — AB (ref 13.0–17.0)
LYMPHS PCT: 33 %
Lymphs Abs: 2.8 10*3/uL (ref 0.7–4.0)
MCH: 34 pg (ref 26.0–34.0)
MCHC: 32.5 g/dL (ref 30.0–36.0)
MCV: 104.4 fL — AB (ref 78.0–100.0)
MONO ABS: 1 10*3/uL (ref 0.1–1.0)
MONOS PCT: 11 %
NEUTROS ABS: 4.6 10*3/uL (ref 1.7–7.7)
NEUTROS PCT: 53 %
Platelets: 183 10*3/uL (ref 150–400)
RBC: 3.18 MIL/uL — ABNORMAL LOW (ref 4.22–5.81)
RDW: 14.8 % (ref 11.5–15.5)
WBC: 8.6 10*3/uL (ref 4.0–10.5)

## 2016-01-21 LAB — BRAIN NATRIURETIC PEPTIDE: B NATRIURETIC PEPTIDE 5: 4102.6 pg/mL — AB (ref 0.0–100.0)

## 2016-01-21 LAB — I-STAT TROPONIN, ED: Troponin i, poc: 0.05 ng/mL (ref 0.00–0.08)

## 2016-01-21 MED ORDER — FUROSEMIDE 10 MG/ML IJ SOLN
40.0000 mg | Freq: Once | INTRAMUSCULAR | Status: AC
  Administered 2016-01-22: 40 mg via INTRAVENOUS
  Filled 2016-01-21: qty 4

## 2016-01-21 NOTE — H&P (Signed)
Patient ID: IZAI NADEN MRN: [id number], DOB/AGE: Jul 30, 1922   Admit date: 01/21/2016   Primary Physician: [name], MD Primary Cardiologist: Dr. Daneen Schick  Pt. Profile: 82 yo male w/ severe AS (valve area 0.44, PG 66, MG 42), CAD (s/p CABG '98 - LIMA to diag + LAD, SVG to OM1 + OM2), CKDIV and HTN who presents with worsening SOB and LE edema.  He reports his kidney doctor > 2 weeks ago stopped his lasix 2/2 ?low blood pressure. During this time, he has become increasing SOB with DOE, even after 10 feet of walking. His cardiologist restarted lasix 20mg  daily for last 4-5 days, however he continued to have SOB and DOE.   Denies any Cp, fevers or chills.   Is scheduled to undergo TAVR work-up soon.  Problem List  Past Medical History:  Diagnosis Date  . Aortic valve disorders   . CAD (coronary artery disease) 1998   s/p cabg x4  . Carotid arterial disease ([place])   . Chronic kidney disease (CKD), stage IV (severe) ([place])   . Heart murmur   . HTN (hypertension)   . Hyperlipidemia   . Macular degeneration   . Severe aortic stenosis     Past Surgical History:  Procedure Laterality Date  . CAROTID ENDARTERECTOMY, LEFT  03/11/1997   Dr. Kellie Simmering  . CORONARY ARTERY BYPASS GRAFT  01/15/97   CABGx4 by Dr Redmond Pulling (LIMA to diag + LAD, SVG to OM1 + OM2)  . left knee arthroscopic sugery    . right pointer finger surgery secondary to industrial accident       Allergies  Allergies  Allergen Reactions  . Penicillins Rash    Has patient had a PCN reaction causing immediate rash, facial/tongue/throat swelling, SOB or lightheadedness with hypotension: No Has patient had a PCN reaction causing severe rash involving mucus membranes or skin necrosis: No Has patient had a PCN reaction that required hospitalization No Has patient had a PCN reaction occurring within the last 10 years: No If all of the above answers are "NO", then may proceed with Cephalosporin use.   Home  Medications  Prior to Admission medications   Medication Sig Start Date End Date Taking? Authorizing Provider  aspirin 81 MG tablet Take 81 mg by mouth daily.      Historical Provider, MD  furosemide (LASIX) 20 MG tablet Take 20 mg by mouth daily.  08/21/13   Historical Provider, MD  latanoprost (XALATAN) 0.005 % ophthalmic solution Place 1 drop into both eyes at bedtime.     Historical Provider, MD  metoprolol (LOPRESSOR) 50 MG tablet Take 50 mg by mouth every morning. Verified lopressor once daily    Historical Provider, MD  Misc Natural Products (OSTEO BI-FLEX ADV JOINT SHIELD PO) Take 1 tablet by mouth daily.     Historical Provider, MD  Multiple Vitamins-Minerals [facility]) TABS Take 1 tablet by mouth QID.      Historical Provider, MD  SIMBRINZA 1-0.2 % SUSP Place 1 drop into both eyes 2 (two) times daily. 05/28/15   Historical Provider, MD  simvastatin (ZOCOR) 20 MG tablet Take 20 mg by mouth daily at 6 PM.    Historical Provider, MD    Family History  Family History  Problem Relation Age of Onset  . Cancer - Ovarian Mother     Social History  Social History   Social History  . Marital status: Married    Spouse name: N/A  . Number of children:  3  . Years of education: N/A   Occupational History  . unemployed, retired     Social History Main Topics  . Smoking status: Never Smoker  . Smokeless tobacco: Never Used  . Alcohol use Yes     Comment: 4oz wine per day  . Drug use: No  . Sexual activity: Not on file   Other Topics Concern  . Not on file   Social History Narrative  . No narrative on file     Review of Systems General:  No chills, fever, night sweats or weight changes.  Cardiovascular:  No chest pain, +dyspnea on exertion, +edema, orthopnea, palpitations, paroxysmal nocturnal dyspnea. Dermatological: No rash, lesions/masses Respiratory: No cough, dyspnea Urologic: No hematuria, dysuria Abdominal:   No nausea, vomiting, diarrhea, bright red  blood per rectum, melena, or hematemesis Neurologic:  No visual changes, wkns, changes in mental status. All other systems reviewed and are otherwise negative except as noted above.  Physical Exam  Blood pressure 127/77, pulse 86, temperature 97.9 F (36.6 C), temperature source Oral, resp. rate 19, height 5\' 6"  (1.676 m), weight 73.9 kg (163 lb), SpO2 98 %.  General: Pleasant, NAD Psych: Normal affect. Neuro: Alert and oriented X 3. Moves all extremities spontaneously. HEENT: Normal  Neck: Supple without bruits or JVD. Lungs:  Resp regular and unlabored, CTA. Heart: III/IV systolic murmur, absent S2, late peaking Abdomen: Soft, non-tender, non-distended, BS + x 4.  Extremities: 2+edema to shins.  DP/PT/Radials 2+ and equal bilaterally.  Labs  Troponin [facility] of Care Test)  Recent Labs  01/21/16 1752  TROPIPOC 0.05   No results for input(s): CKTOTAL, CKMB, TROPONINI in the last 72 hours. Lab Results  Component Value Date   WBC 8.6 01/21/2016   HGB 10.8 (L) 01/21/2016   HCT 33.2 (L) 01/21/2016   MCV 104.4 (H) 01/21/2016   PLT 183 01/21/2016    Recent Labs Lab 01/21/16 1727  NA 141  K 4.2  CL 105  CO2 24  BUN 61*  CREATININE 3.21*  CALCIUM 9.3  GLUCOSE 101*   No results found for: CHOL, HDL, LDLCALC, TRIG No results found for: DDIMER   Radiology/Studies  Dg Chest 2 View  Result Date: 01/21/2016 CLINICAL DATA:  Shortness of breath. EXAM: CHEST  2 VIEW COMPARISON:  01/07/2016 chest radiograph. FINDINGS: Stable configuration of sternotomy wires with stable discontinuity in the upper most sternotomy wire. Surgical clips overlie the medial left lower neck and left upper abdomen. Stable cardiomediastinal silhouette with mild cardiomegaly and aortic atherosclerosis. No pneumothorax. Small bilateral pleural effusions appear stable. Borderline mild pulmonary edema. Patchy bibasilar lung opacities. IMPRESSION: 1. Stable mild cardiomegaly and borderline mild pulmonary  edema, suggesting mild congestive heart failure. 2. Stable small bilateral pleural effusions . 3. Patchy bibasilar lung opacities, favor atelectasis . 4. Aortic atherosclerosis. Electronically Signed   By: Ilona Sorrel M.D.   On: 01/21/2016 17:58   Dg Chest 2 View  Result Date: 01/07/2016 CLINICAL DATA:  Preoperative examination prior to aortic valve replacement. History of severe aortic stenosis with recent development of exertional shortness of breath. History of CABG in the past. EXAM: CHEST  2 VIEW COMPARISON:  PA and lateral chest x-ray of December 13, 2014 FINDINGS: There are new small bilateral pleural effusions. There is no definite infiltrate. The heart is top-normal in size. The pulmonary vascularity is mildly prominent centrally. There is calcification in the wall of the aortic arch. There are post CABG changes. There is calcification of portions of the  anterior longitudinal ligament of the thoracic spine. IMPRESSION: New bilateral pleural effusions.  Mild CHF.  No definite pneumonia. Aortic atherosclerosis. Electronically Signed   By: David  Martinique M.D.   On: 01/07/2016 09:09    ECG NSR   Echocardiogram 01/04/2016 - Left ventricle: The cavity size was mildly dilated. There was   mild focal basal hypertrophy of the septum. Systolic function was   mildly to moderately reduced. The estimated ejection fraction was   45%. Diffuse hypokinesis. There is akinesis of the   basal-midinferior myocardium. Features are consistent with a   pseudonormal left ventricular filling pattern, with concomitant   abnormal relaxation and increased filling pressure (grade 2   diastolic dysfunction). Doppler parameters are consistent with   high ventricular filling pressure. - Aortic valve: Severely calcified annulus. Trileaflet; severely   thickened, moderately calcified leaflets. Valve mobility was   restricted. There was severe stenosis. There was trivial   regurgitation. Mean gradient (S): 42 mm Hg.  Peak gradient (S): 66   mm Hg. Valve area (VTI): 0.44 cm^2. Valve area (Vmax): 0.44 cm^2.   Valve area (Vmean): 0.43 cm^2. - Mitral valve: Severely calcified annulus. , with moderate   involvement of chords. There was trivial regurgitation. Valve   area by continuity equation (using LVOT flow): 1.84 cm^2. - Left atrium: The atrium was mildly dilated. - Tricuspid valve: There was trivial regurgitation. - Pulmonic valve: There was trivial regurgitation. - Pulmonary arteries: PA peak pressure: 43 mm Hg (S).    ASSESSMENT AND PLAN 82 yo male w/ severe AS (valve area 0.44, PG 66, MG 42), CAD (s/p CABG '98 - LIMA to diag + LAD, SVG to OM1 + OM2), CKDIV and HTN who presents with worsening SOB and LE edema.  # Volume overload: likely from being off lasix, then restarted and Cr now 3.21 in the setting of severe AS - will need gentle diuresis in setting of severe AS - lasix 40mg  IV x 1, re-assess in AM response - repeat BMP in AM  # Severe AS - was to undergo LHC prior to TAVR to assess graft patency and coronary ostial locations, may consider doing this as inpatient if Cr improves - control BP  # AKI on CKD: likely component of congestion + poor forward flow - gentle diuresis  # CAD - ASA, statin  # HTN - metop and lasix  FULL CODE  Signed, Charlies Silvers, MD

## 2016-01-21 NOTE — Telephone Encounter (Signed)
    I talked to Mr. Brutus son who thinks he is significantly worse over the last couple weeks. He has a history of critical AS and is dramatically worse in terms of SOB and energy over the last couple weeks. I have asked him to bring him into the ER for evaluation.    Angelena Form PA-C  [facility]

## 2016-01-21 NOTE — ED Provider Notes (Signed)
[facility] DEPT Provider Note   CSN: [id number] Arrival date & time: 01/21/16  1716 By signing my name below, I, Shawn Long, attest that this documentation has been prepared under the direction and in the presence of Shawn Greek, MD . Electronically Signed: Dyke Long, Scribe. 01/21/2016. 11:22 PM.   History   Chief Complaint Chief Complaint  Patient presents with  . Shortness of Breath    HPI Shawn Long is a 82 y.o. male with a hx of CAD, CKD stage IV, and HTN who presents to the Emergency Department complaining of progressively worsening, intermittent shortness of breath which began two weeks ago. He states SOB is exacerbated by exertion or lying flat; per pt, he can not walk more than 15 steps without having difficulty breathing. SOB is alleviated by rest. He also complains of associated bilateral leg swelling. Pt states he is scheduled for a cardiac valve replacement in 4-6 weeks. Pt is followed by cardiologist Dr. Daneen Schick. He denies any SOB while at rest. Pt denies any chest pain.   The history is provided by the patient. No language interpreter was used.   Past Medical History:  Diagnosis Date  . Aortic valve disorders   . CAD (coronary artery disease) 1998   s/p cabg x4  . Carotid arterial disease ([place])   . Chronic kidney disease (CKD), stage IV (severe) ([place])   . Heart murmur   . HTN (hypertension)   . Hyperlipidemia   . Macular degeneration   . Severe aortic stenosis     Patient Active Problem List   Diagnosis Date Noted  . Macular degeneration   . HTN (hypertension)   . Hyperlipidemia   . Severe aortic stenosis   . CAD (coronary artery disease) of artery bypass graft   . Carotid arterial disease ([place])   . Chronic kidney disease (CKD), stage IV (severe) ([facility])     Past Surgical History:  Procedure Laterality Date  . CAROTID ENDARTERECTOMY, LEFT  03/11/1997   Dr. Kellie Simmering  . CORONARY ARTERY BYPASS GRAFT  01/15/97   CABGx4 by Dr  Redmond Pulling (LIMA to diag + LAD, SVG to OM1 + OM2)  . left knee arthroscopic sugery    . right pointer finger surgery secondary to industrial accident      Home Medications    Prior to Admission medications   Medication Sig Start Date End Date Taking? Authorizing Provider  aspirin 81 MG tablet Take 81 mg by mouth daily.      Historical Provider, MD  furosemide (LASIX) 20 MG tablet Take 20 mg by mouth daily.  08/21/13   Historical Provider, MD  latanoprost (XALATAN) 0.005 % ophthalmic solution Place 1 drop into both eyes at bedtime.     Historical Provider, MD  metoprolol (LOPRESSOR) 50 MG tablet Take 50 mg by mouth every morning. Verified lopressor once daily    Historical Provider, MD  Misc Natural Products (OSTEO BI-FLEX ADV JOINT SHIELD PO) Take 1 tablet by mouth daily.     Historical Provider, MD  Multiple Vitamins-Minerals [facility]) TABS Take 1 tablet by mouth QID.      Historical Provider, MD  SIMBRINZA 1-0.2 % SUSP Place 1 drop into both eyes 2 (two) times daily. 05/28/15   Historical Provider, MD  simvastatin (ZOCOR) 20 MG tablet Take 20 mg by mouth daily at 6 PM.    Historical Provider, MD    Family History Family History  Problem Relation Age of Onset  . Cancer - Ovarian  Mother     Social History Social History  Substance Use Topics  . Smoking status: Never Smoker  . Smokeless tobacco: Never Used  . Alcohol use Yes     Comment: 4oz wine per day    Allergies   Penicillins  Review of Systems Review of Systems 10 systems reviewed and all are negative for acute change except as noted in the HPI.  Physical Exam Updated Vital Signs BP 127/77   Pulse 86   Temp 97.9 F (36.6 C) (Oral)   Resp 19   Ht 5\' 6"  (1.676 m)   Wt 163 lb (73.9 kg)   SpO2 98%   BMI 26.31 kg/m   Physical Exam  Constitutional: He is oriented to person, place, and time. He appears well-developed and well-nourished. No distress.  HENT:  Head: Normocephalic and atraumatic.  Right Ear:  Hearing normal.  Left Ear: Hearing normal.  Nose: Nose normal.  Mouth/Throat: Oropharynx is clear and moist and mucous membranes are normal.  Eyes: Conjunctivae and EOM are normal. Pupils are equal, round, and reactive to light.  Neck: Normal range of motion. Neck supple.  Cardiovascular: Regular rhythm, S1 normal and S2 normal.  Exam reveals no gallop and no friction rub.   Murmur heard.  Systolic murmur is present with a grade of 3/6  Pulmonary/Chest: Effort normal. No respiratory distress. He exhibits no tenderness.  Basilar crackles  Abdominal: Soft. Normal appearance and bowel sounds are normal. There is no hepatosplenomegaly. There is no tenderness. There is no rebound, no guarding, no tenderness at McBurney's point and negative Murphy's sign. No hernia.  Musculoskeletal: Normal range of motion. He exhibits edema (2-3 pitting edema bilaterally).  Neurological: He is alert and oriented to person, place, and time. He has normal strength. No cranial nerve deficit or sensory deficit. Coordination normal. GCS eye subscore is 4. GCS verbal subscore is 5. GCS motor subscore is 6.  Skin: Skin is warm, dry and intact. No rash noted. No cyanosis.  Psychiatric: He has a normal mood and affect. His speech is normal and behavior is normal. Thought content normal.  Nursing note and vitals reviewed.  ED Treatments / Results  DIAGNOSTIC STUDIES:  Oxygen Saturation is 97% on RA, normal by my interpretation.    COORDINATION OF CARE:  11:14 PM Pt to be admitted. Discussed treatment plan with pt at bedside and pt agreed to plan.   Labs (all labs ordered are listed, but only abnormal results are displayed) Labs Reviewed  BASIC METABOLIC PANEL - Abnormal; Notable for the following:       Result Value   Glucose, Bld 101 (*)    BUN 61 (*)    Creatinine, Ser 3.21 (*)    GFR calc non Af Amer 16 (*)    GFR calc Af Amer 18 (*)    All other components within normal limits  CBC WITH  DIFFERENTIAL/PLATELET - Abnormal; Notable for the following:    RBC 3.18 (*)    Hemoglobin 10.8 (*)    HCT 33.2 (*)    MCV 104.4 (*)    All other components within normal limits  BRAIN NATRIURETIC PEPTIDE - Abnormal; Notable for the following:    B Natriuretic Peptide 4,102.6 (*)    All other components within normal limits  I-STAT TROPOININ, ED    EKG  EKG Interpretation  Date/Time:  Friday January 21 2016 17:27:36 EST Ventricular Rate:  91 PR Interval:  182 QRS Duration: 102 QT Interval:  378 QTC Calculation:  464 R Axis:   55 Text Interpretation:  Normal sinus rhythm ST & T wave abnormality, consider inferior ischemia Prolonged QT Abnormal ECG No previous tracing Confirmed by KNOTT MD, DANIEL [phone]) on 01/21/2016 5:26:29 PM       Radiology Dg Chest 2 View  Result Date: 01/21/2016 CLINICAL DATA:  Shortness of breath. EXAM: CHEST  2 VIEW COMPARISON:  01/07/2016 chest radiograph. FINDINGS: Stable configuration of sternotomy wires with stable discontinuity in the upper most sternotomy wire. Surgical clips overlie the medial left lower neck and left upper abdomen. Stable cardiomediastinal silhouette with mild cardiomegaly and aortic atherosclerosis. No pneumothorax. Small bilateral pleural effusions appear stable. Borderline mild pulmonary edema. Patchy bibasilar lung opacities. IMPRESSION: 1. Stable mild cardiomegaly and borderline mild pulmonary edema, suggesting mild congestive heart failure. 2. Stable small bilateral pleural effusions . 3. Patchy bibasilar lung opacities, favor atelectasis . 4. Aortic atherosclerosis. Electronically Signed   By: Ilona Sorrel M.D.   On: 01/21/2016 17:58    Procedures Procedures (including critical care time)  Medications Ordered in ED Medications  furosemide (LASIX) injection 40 mg (not administered)     Initial Impression / Assessment and Plan / ED Course  I have reviewed the triage vital signs and the nursing notes.  Pertinent labs &  imaging results that were available during my care of the patient were reviewed by me and considered in my medical decision making (see chart for details).  Clinical Course    Patient presents with complaints of increasing shortness of breath. Patient has a history of severe aortic stenosis. He is going to be having aortic valve surgery within the next 1-1/2 months. He was referred to the ER tonight by cardiology because of his worsening shortness of breath. He tells me he is now unable to walk more than 15 paces at home without having to stop and sit down. He is not expressing any chest pain.  Workup today suggests volume overload. He also has significant renal insufficiency which will make diuresis more difficult. Discussed with cardiology, patient will be admitted by cardiology for further management. Lasix diuresis initiated in ER.  Final Clinical Impressions(s) / ED Diagnoses   Final diagnoses:  Aortic valve stenosis, etiology of cardiac valve disease unspecified  Acute on chronic congestive heart failure, unspecified congestive heart failure type [facility])   New Prescriptions New Prescriptions   No medications on file  I personally performed the services described in this documentation, which was scribed in my presence. The recorded information has been reviewed and is accurate.    Shawn Greek, MD 01/21/16 [phone]

## 2016-01-21 NOTE — ED Triage Notes (Signed)
Pt reports shortness of breath X1 week. Pt states he is scheduled for a valve replacement in 4-6 weeks. Pt is tearful in triage. Vital signs stable.

## 2016-01-21 NOTE — ED Notes (Signed)
Pt is due for heart surgery in 4-6 weeks for a valve replacement. Over the course of the past 2 weeks the pt reports that he is getting increasingly SOB with movement. Pt contacted his Cardiologist, Dr. Daneen Schick, and the on call PA advised the pt to come to the ED for further evaluation.

## 2016-01-22 DIAGNOSIS — I2510 Atherosclerotic heart disease of native coronary artery without angina pectoris: Secondary | ICD-10-CM | POA: Diagnosis present

## 2016-01-22 DIAGNOSIS — Z880 Allergy status to penicillin: Secondary | ICD-10-CM | POA: Diagnosis not present

## 2016-01-22 DIAGNOSIS — N179 Acute kidney failure, unspecified: Secondary | ICD-10-CM | POA: Diagnosis present

## 2016-01-22 DIAGNOSIS — Z951 Presence of aortocoronary bypass graft: Secondary | ICD-10-CM | POA: Diagnosis not present

## 2016-01-22 DIAGNOSIS — Z79899 Other long term (current) drug therapy: Secondary | ICD-10-CM | POA: Diagnosis not present

## 2016-01-22 DIAGNOSIS — I959 Hypotension, unspecified: Secondary | ICD-10-CM | POA: Diagnosis present

## 2016-01-22 DIAGNOSIS — Z7982 Long term (current) use of aspirin: Secondary | ICD-10-CM | POA: Diagnosis not present

## 2016-01-22 DIAGNOSIS — I350 Nonrheumatic aortic (valve) stenosis: Secondary | ICD-10-CM | POA: Diagnosis not present

## 2016-01-22 DIAGNOSIS — H3530 Unspecified macular degeneration: Secondary | ICD-10-CM | POA: Diagnosis present

## 2016-01-22 DIAGNOSIS — I130 Hypertensive heart and chronic kidney disease with heart failure and stage 1 through stage 4 chronic kidney disease, or unspecified chronic kidney disease: Secondary | ICD-10-CM | POA: Diagnosis present

## 2016-01-22 DIAGNOSIS — R0602 Shortness of breath: Secondary | ICD-10-CM | POA: Diagnosis not present

## 2016-01-22 DIAGNOSIS — I5043 Acute on chronic combined systolic (congestive) and diastolic (congestive) heart failure: Secondary | ICD-10-CM | POA: Diagnosis present

## 2016-01-22 DIAGNOSIS — N184 Chronic kidney disease, stage 4 (severe): Secondary | ICD-10-CM | POA: Diagnosis present

## 2016-01-22 DIAGNOSIS — E785 Hyperlipidemia, unspecified: Secondary | ICD-10-CM | POA: Diagnosis present

## 2016-01-22 LAB — BASIC METABOLIC PANEL
ANION GAP: 12 (ref 5–15)
BUN: 59 mg/dL — ABNORMAL HIGH (ref 6–20)
CALCIUM: 9.3 mg/dL (ref 8.9–10.3)
CO2: 24 mmol/L (ref 22–32)
CREATININE: 3.06 mg/dL — AB (ref 0.61–1.24)
Chloride: 104 mmol/L (ref 101–111)
GFR, EST AFRICAN AMERICAN: 19 mL/min — AB (ref >60)
GFR, EST NON AFRICAN AMERICAN: 16 mL/min — AB (ref >60)
Glucose, Bld: 130 mg/dL — ABNORMAL HIGH (ref 65–99)
Potassium: 3.9 mmol/L (ref 3.5–5.1)
Sodium: 140 mmol/L (ref 135–145)

## 2016-01-22 LAB — MAGNESIUM: Magnesium: 2.2 mg/dL (ref 1.7–2.4)

## 2016-01-22 MED ORDER — PROSIGHT PO TABS
2.0000 | ORAL_TABLET | Freq: Two times a day (BID) | ORAL | Status: DC
  Administered 2016-01-22 – 2016-01-23 (×3): 2 via ORAL
  Filled 2016-01-22 (×3): qty 1
  Filled 2016-01-22: qty 2

## 2016-01-22 MED ORDER — SODIUM CHLORIDE 0.9% FLUSH
3.0000 mL | Freq: Two times a day (BID) | INTRAVENOUS | Status: DC
  Administered 2016-01-22 – 2016-01-23 (×3): 3 mL via INTRAVENOUS

## 2016-01-22 MED ORDER — METOPROLOL TARTRATE 50 MG PO TABS
50.0000 mg | ORAL_TABLET | Freq: Every morning | ORAL | Status: DC
  Administered 2016-01-22 – 2016-01-23 (×2): 50 mg via ORAL
  Filled 2016-01-22 (×2): qty 1

## 2016-01-22 MED ORDER — SODIUM CHLORIDE 0.9 % IV SOLN: 250.0000 mL | INTRAVENOUS | Status: DC | PRN

## 2016-01-22 MED ORDER — HEPARIN SODIUM (PORCINE) 5000 UNIT/ML IJ SOLN
5000.0000 [IU] | Freq: Three times a day (TID) | INTRAMUSCULAR | Status: DC
  Administered 2016-01-22 – 2016-01-23 (×4): 5000 [IU] via SUBCUTANEOUS
  Filled 2016-01-22 (×4): qty 1

## 2016-01-22 MED ORDER — OSTEO BI-FLEX ADV JOINT SHIELD PO TABS: ORAL_TABLET | Freq: Every day | ORAL | Status: DC

## 2016-01-22 MED ORDER — ASPIRIN EC 81 MG PO TBEC
81.0000 mg | DELAYED_RELEASE_TABLET | Freq: Every day | ORAL | Status: DC
  Administered 2016-01-22 – 2016-01-23 (×2): 81 mg via ORAL
  Filled 2016-01-22 (×2): qty 1

## 2016-01-22 MED ORDER — SODIUM CHLORIDE 0.9% FLUSH: 3.0000 mL | INTRAVENOUS | Status: DC | PRN

## 2016-01-22 MED ORDER — LATANOPROST 0.005 % OP SOLN
1.0000 [drp] | Freq: Every day | OPHTHALMIC | Status: DC
  Administered 2016-01-22 (×2): 1 [drp] via OPHTHALMIC
  Filled 2016-01-22: qty 2.5

## 2016-01-22 MED ORDER — ACETAMINOPHEN 325 MG PO TABS: 650.0000 mg | ORAL_TABLET | ORAL | Status: DC | PRN

## 2016-01-22 MED ORDER — SIMVASTATIN 20 MG PO TABS
20.0000 mg | ORAL_TABLET | Freq: Every day | ORAL | Status: DC
  Administered 2016-01-22: 20 mg via ORAL
  Filled 2016-01-22: qty 1

## 2016-01-22 MED ORDER — ONDANSETRON HCL 4 MG/2ML IJ SOLN: 4.0000 mg | Freq: Four times a day (QID) | INTRAMUSCULAR | Status: DC | PRN

## 2016-01-22 MED ORDER — FUROSEMIDE 10 MG/ML IJ SOLN
20.0000 mg | Freq: Once | INTRAMUSCULAR | Status: AC
  Administered 2016-01-22: 20 mg via INTRAVENOUS
  Filled 2016-01-22: qty 2

## 2016-01-22 NOTE — Progress Notes (Signed)
Patient Name: Shawn Long Date of Encounter: 01/22/2016  Primary Cardiologist: [facility] Problem List     Active Problems:   Aortic stenosis, severe     Subjective   Feeling much improved with IV lasix. Is able to walk across the room without significant shortness of breath.  Inpatient Medications    Scheduled Meds: . aspirin EC  81 mg Oral Daily  . heparin  5,000 Units Subcutaneous Q8H  . latanoprost  1 drop Both Eyes QHS  . metoprolol  50 mg Oral q morning - 10a  . multivitamin  2 tablet Oral BID  . simvastatin  20 mg Oral q1800  . sodium chloride flush  3 mL Intravenous Q12H   Continuous Infusions:  PRN Meds: sodium chloride, acetaminophen, ondansetron (ZOFRAN) IV, sodium chloride flush   Vital Signs    Vitals:   01/22/16 0115 01/22/16 0142 01/22/16 0716 01/22/16 1129  BP: 127/94 125/80 110/62 110/66  Pulse: 86 90 86 72  Resp: 22 20 18 18   Temp:  97.6 F (36.4 C) 97.7 F (36.5 C) 97.5 F (36.4 C)  TempSrc:  Oral Oral Oral  SpO2: 91% 97% 95% 97%  Weight:  156 lb 11.2 oz (71.1 kg)    Height:  5\' 6"  (1.676 m)      Intake/Output Summary (Last 24 hours) at 01/22/16 1223 Last data filed at 01/22/16 1012  Gross per 24 hour  Intake              583 ml  Output             1250 ml  Net             -667 ml   Filed Weights   01/21/16 2235 01/22/16 0142  Weight: 163 lb (73.9 kg) 156 lb 11.2 oz (71.1 kg)    Physical Exam    GEN: Well nourished, well developed, in no acute distress.  HEENT: Grossly normal.  Neck: Supple, no JVD, carotid bruits, or masses. Cardiac: RRR, no murmurs, rubs, or gallops. No clubbing, cyanosis, 2+ edema.  Radials/DP/PT 2+ and equal bilaterally.  Respiratory:  Respirations regular and unlabored, clear to auscultation bilaterally. GI: Soft, nontender, nondistended, BS + x 4. MS: no deformity or atrophy. Skin: warm and dry, no rash. Neuro:  Strength and sensation are intact. Psych: AAOx3.  Normal affect.  Labs     CBC  Recent Labs  01/21/16 1727  WBC 8.6  NEUTROABS 4.6  HGB 10.8*  HCT 33.2*  MCV 104.4*  PLT [id number]   Basic Metabolic Panel  Recent Labs  01/21/16 1727 01/22/16 0809  NA 141 140  K 4.2 3.9  CL 105 104  CO2 24 24  GLUCOSE 101* 130*  BUN 61* 59*  CREATININE 3.21* 3.06*  CALCIUM 9.3 9.3  MG  --  2.2   Liver Function Tests No results for input(s): AST, ALT, ALKPHOS, BILITOT, PROT, ALBUMIN in the last 72 hours. No results for input(s): LIPASE, AMYLASE in the last 72 hours. Cardiac Enzymes No results for input(s): CKTOTAL, CKMB, CKMBINDEX, TROPONINI in the last 72 hours. BNP Invalid input(s): POCBNP D-Dimer No results for input(s): DDIMER in the last 72 hours. Hemoglobin A1C No results for input(s): HGBA1C in the last 72 hours. Fasting Lipid Panel No results for input(s): CHOL, HDL, LDLCALC, TRIG, CHOLHDL, LDLDIRECT in the last 72 hours. Thyroid Function Tests No results for input(s): TSH, T4TOTAL, T3FREE, THYROIDAB in the last 72 hours.  Invalid input(s): FREET3  Telemetry    Sinus tachycardia with PVCs - Personally Reviewed  ECG    Sinus rhythm, inferior TWI - Personally Reviewed  Radiology    Dg Chest 2 View  Result Date: 01/21/2016 CLINICAL DATA:  Shortness of breath. EXAM: CHEST  2 VIEW COMPARISON:  01/07/2016 chest radiograph. FINDINGS: Stable configuration of sternotomy wires with stable discontinuity in the upper most sternotomy wire. Surgical clips overlie the medial left lower neck and left upper abdomen. Stable cardiomediastinal silhouette with mild cardiomegaly and aortic atherosclerosis. No pneumothorax. Small bilateral pleural effusions appear stable. Borderline mild pulmonary edema. Patchy bibasilar lung opacities. IMPRESSION: 1. Stable mild cardiomegaly and borderline mild pulmonary edema, suggesting mild congestive heart failure. 2. Stable small bilateral pleural effusions . 3. Patchy bibasilar lung opacities, favor atelectasis . 4. Aortic  atherosclerosis. Electronically Signed   By: Ilona Sorrel M.D.   On: 01/21/2016 17:58    Cardiac Studies   TTE  - Left ventricle: The cavity size was mildly dilated. There was   mild focal basal hypertrophy of the septum. Systolic function was   mildly to moderately reduced. The estimated ejection fraction was   45%. Diffuse hypokinesis. There is akinesis of the   basal-midinferior myocardium. Features are consistent with a   pseudonormal left ventricular filling pattern, with concomitant   abnormal relaxation and increased filling pressure (grade 2   diastolic dysfunction). Doppler parameters are consistent with   high ventricular filling pressure. - Aortic valve: Severely calcified annulus. Trileaflet; severely   thickened, moderately calcified leaflets. Valve mobility was   restricted. There was severe stenosis. There was trivial   regurgitation. Mean gradient (S): 42 mm Hg. Peak gradient (S): 66   mm Hg. Valve area (VTI): 0.44 cm^2. Valve area (Vmax): 0.44 cm^2.   Valve area (Vmean): 0.43 cm^2. - Mitral valve: Severely calcified annulus. , with moderate   involvement of chords. There was trivial regurgitation. Valve   area by continuity equation (using LVOT flow): 1.84 cm^2. - Left atrium: The atrium was mildly dilated. - Tricuspid valve: There was trivial regurgitation. - Pulmonic valve: There was trivial regurgitation. - Pulmonary arteries: PA peak pressure: 43 mm Hg (S).  Patient Profile     82 yo male w/ severe AS (valve area 0.44, PG 66, MG 42), CAD (s/p CABG '98 - LIMA to diag + LAD, SVG to OM1 + OM2), CKDIV and HTN who presents with worsening SOB and LE edema.  Assessment & Plan    # Volume overload: Creatinine has improved with diuresis to 3.06. His net out 600 mL. We'll continue IV diuresis today and switch to by mouth tomorrow.  # Severe AS - plan for TAVR in the future  # AKI on CKD: likely component of congestion + poor forward flow - gentle diuresis,  improving creatinine  # CAD - ASA, statin  # HTN - metop and lasix  Signed, Jas Betten Meredith Leeds, MD  01/22/2016, 12:23 PM

## 2016-01-22 NOTE — Progress Notes (Signed)
Patient is in room resting, CCMD called, admission assessment completed.

## 2016-01-22 NOTE — Evaluation (Signed)
Physical Therapy Evaluation Patient Details Name: Shawn Long MRN: [id number] DOB: 1922-03-11 Today's Date: 01/22/2016   History of Present Illness  Pt is a very pleasant 82 y/o male admitted secondary to dyspnea and volume overload. PMH including but not limited to CAD s/p CABG x4 in 1998, CKD stage IV, HTN, macular degeneration, severe aortic stenosis.  Clinical Impression  Pt presented sitting OOB in recliner when PT entered room. Prior to admission, pt reported that he ambulates within his home using a rollator and when outside he uses a SPC. Pt states that he lives with his wife of 19 years in an independent living facility. Pt moving well during evaluation with min guard for safety with functional mobility, no physical assistance needed. Pt's SPO2 decreased to as low as 87% during ambulation on RA but quickly increased to 92% after sitting in recliner after ambulation. Pt would continue to benefit from skilled physical therapy services at this time while admitted to address his below listed limitations in order to improve his overall safety and independence with functional mobility..      Follow Up Recommendations Supervision for mobility/OOB    Equipment Recommendations  None recommended by PT    Recommendations for Other Services       Precautions / Restrictions Precautions Precautions: Fall Restrictions Weight Bearing Restrictions: No      Mobility  Bed Mobility               General bed mobility comments: pt sitting OOB in recliner when PT entered room  Transfers Overall transfer level: Needs assistance Equipment used: Rolling walker (2 wheeled) Transfers: Sit to/from Stand Sit to Stand: Min guard         General transfer comment: pt required increased time, good hand placement, min guard for safety  Ambulation/Gait Ambulation/Gait assistance: Min guard Ambulation Distance (Feet): 75 Feet Assistive device: Rolling walker (2 wheeled) Gait  Pattern/deviations: Step-through pattern;Decreased stride length;Trunk flexed Gait velocity: decreased Gait velocity interpretation: <1.8 ft/sec, indicative of risk for recurrent falls General Gait Details: pt required occasional VC'ing to maintain a safe distance from [employer]    Modified Rankin (Stroke Patients Only)       Balance Overall balance assessment: Needs assistance Sitting-balance support: Feet supported;No upper extremity supported Sitting balance-Leahy Scale: Fair     Standing balance support: During functional activity;Bilateral upper extremity supported Standing balance-Leahy Scale: Poor Standing balance comment: pt reliant on bilateral UEs on RW                             Pertinent Vitals/Pain Pain Assessment: No/denies pain    Home Living Family/patient expects to be discharged to:: Other (Comment) ([place]; pt reported he and his wife go to the dining hall to eat and that he uses his cane to ambulate there and back. He stated that there is one step to get into the dining hall and he has never had any trouble ascending or descending the step with his cane.) Living Arrangements: Spouse/significant other                    Prior Function Level of Independence: Independent with assistive device(s)         Comments: pt states that he ambulates with use of SPC and rollator     Hand Dominance  Extremity/Trunk Assessment   Upper Extremity Assessment: Overall WFL for tasks assessed           Lower Extremity Assessment: Generalized weakness      Cervical / Trunk Assessment: Kyphotic  Communication   Communication: No difficulties  Cognition Arousal/Alertness: Awake/alert Behavior During Therapy: WFL for tasks assessed/performed Overall Cognitive Status: Within Functional Limits for tasks assessed                      General Comments       Exercises     Assessment/Plan    PT Assessment Patient needs continued PT services  PT Problem List Decreased strength;Decreased activity tolerance;Decreased balance;Decreased mobility;Decreased coordination;Decreased knowledge of use of DME;Decreased safety awareness;Cardiopulmonary status limiting activity          PT Treatment Interventions DME instruction;Gait training;Stair training;Functional mobility training;Therapeutic activities;Therapeutic exercise;Balance training;Neuromuscular re-education;Patient/family education    PT Goals (Current goals can be found in the Care Plan section)  Acute Rehab PT Goals Patient Stated Goal: return home PT Goal Formulation: With patient Time For Goal Achievement: 02/05/16 Potential to Achieve Goals: Fair    Frequency Min 3X/week   Barriers to discharge        Co-evaluation               End of Session Equipment Utilized During Treatment: Gait belt Activity Tolerance: Patient limited by fatigue Patient left: in chair;with call bell/phone within reach;with chair alarm set Nurse Communication: Mobility status         Time: [id number] PT Time Calculation (min) (ACUTE ONLY): 12 min   Charges:   PT Evaluation $PT Eval Moderate Complexity: 1 Procedure     PT G CodesClearnce Sorrel Sung Parodi 01/22/2016, 5:26 PM Sherie Don, [place], DPT [phone]

## 2016-01-22 NOTE — Progress Notes (Signed)
Pt is alert and uneventful day, has been up in the chair steady with not dizziness or light headness

## 2016-01-22 NOTE — Progress Notes (Signed)
Patient Name: Shawn Long Date of Encounter: 01/22/2016  Primary Cardiologist: [facility] Problem List     Active Problems:   Aortic stenosis, severe     Subjective   Feeling much improved with IV lasix. Is able to walk across the room without significant shortness of breath.  Inpatient Medications    Scheduled Meds: . aspirin EC  81 mg Oral Daily  . heparin  5,000 Units Subcutaneous Q8H  . latanoprost  1 drop Both Eyes QHS  . metoprolol  50 mg Oral q morning - 10a  . multivitamin  2 tablet Oral BID  . simvastatin  20 mg Oral q1800  . sodium chloride flush  3 mL Intravenous Q12H   Continuous Infusions:  PRN Meds: sodium chloride, acetaminophen, ondansetron (ZOFRAN) IV, sodium chloride flush   Vital Signs    Vitals:   01/22/16 0115 01/22/16 0142 01/22/16 0716 01/22/16 1129  BP: 127/94 125/80 110/62 110/66  Pulse: 86 90 86 72  Resp: 22 20 18 18   Temp:  97.6 F (36.4 C) 97.7 F (36.5 C) 97.5 F (36.4 C)  TempSrc:  Oral Oral Oral  SpO2: 91% 97% 95% 97%  Weight:  156 lb 11.2 oz (71.1 kg)    Height:  5\' 6"  (1.676 m)      Intake/Output Summary (Last 24 hours) at 01/22/16 1228 Last data filed at 01/22/16 1012  Gross per 24 hour  Intake              583 ml  Output             1250 ml  Net             -667 ml   Filed Weights   01/21/16 2235 01/22/16 0142  Weight: 163 lb (73.9 kg) 156 lb 11.2 oz (71.1 kg)    Physical Exam    GEN: Well nourished, well developed, in no acute distress.  HEENT: Grossly normal.  Neck: Supple, no JVD, carotid bruits, or masses. Cardiac: RRR, no murmurs, rubs, or gallops. No clubbing, cyanosis, 2+ edema.  Radials/DP/PT 2+ and equal bilaterally.  Respiratory:  Respirations regular and unlabored, clear to auscultation bilaterally. GI: Soft, nontender, nondistended, BS + x 4. MS: no deformity or atrophy. Skin: warm and dry, no rash. Neuro:  Strength and sensation are intact. Psych: AAOx3.  Normal affect.  Labs     CBC  Recent Labs  01/21/16 1727  WBC 8.6  NEUTROABS 4.6  HGB 10.8*  HCT 33.2*  MCV 104.4*  PLT XX123456   Basic Metabolic Panel  Recent Labs  01/21/16 1727 01/22/16 0809  NA 141 140  K 4.2 3.9  CL 105 104  CO2 24 24  GLUCOSE 101* 130*  BUN 61* 59*  CREATININE 3.21* 3.06*  CALCIUM 9.3 9.3  MG  --  2.2   Liver Function Tests No results for input(s): AST, ALT, ALKPHOS, BILITOT, PROT, ALBUMIN in the last 72 hours. No results for input(s): LIPASE, AMYLASE in the last 72 hours. Cardiac Enzymes No results for input(s): CKTOTAL, CKMB, CKMBINDEX, TROPONINI in the last 72 hours. BNP Invalid input(s): POCBNP D-Dimer No results for input(s): DDIMER in the last 72 hours. Hemoglobin A1C No results for input(s): HGBA1C in the last 72 hours. Fasting Lipid Panel No results for input(s): CHOL, HDL, LDLCALC, TRIG, CHOLHDL, LDLDIRECT in the last 72 hours. Thyroid Function Tests No results for input(s): TSH, T4TOTAL, T3FREE, THYROIDAB in the last 72 hours.  Invalid input(s): FREET3  Telemetry    Sinus tachycardia with PVCs - Personally Reviewed  ECG    Sinus rhythm, inferior TWI - Personally Reviewed  Radiology    Dg Chest 2 View  Result Date: 01/21/2016 CLINICAL DATA:  Shortness of breath. EXAM: CHEST  2 VIEW COMPARISON:  01/07/2016 chest radiograph. FINDINGS: Stable configuration of sternotomy wires with stable discontinuity in the upper most sternotomy wire. Surgical clips overlie the medial left lower neck and left upper abdomen. Stable cardiomediastinal silhouette with mild cardiomegaly and aortic atherosclerosis. No pneumothorax. Small bilateral pleural effusions appear stable. Borderline mild pulmonary edema. Patchy bibasilar lung opacities. IMPRESSION: 1. Stable mild cardiomegaly and borderline mild pulmonary edema, suggesting mild congestive heart failure. 2. Stable small bilateral pleural effusions . 3. Patchy bibasilar lung opacities, favor atelectasis . 4. Aortic  atherosclerosis. Electronically Signed   By: Ilona Sorrel M.D.   On: 01/21/2016 17:58    Cardiac Studies   TTE  - Left ventricle: The cavity size was mildly dilated. There was   mild focal basal hypertrophy of the septum. Systolic function was   mildly to moderately reduced. The estimated ejection fraction was   45%. Diffuse hypokinesis. There is akinesis of the   basal-midinferior myocardium. Features are consistent with a   pseudonormal left ventricular filling pattern, with concomitant   abnormal relaxation and increased filling pressure (grade 2   diastolic dysfunction). Doppler parameters are consistent with   high ventricular filling pressure. - Aortic valve: Severely calcified annulus. Trileaflet; severely   thickened, moderately calcified leaflets. Valve mobility was   restricted. There was severe stenosis. There was trivial   regurgitation. Mean gradient (S): 42 mm Hg. Peak gradient (S): 66   mm Hg. Valve area (VTI): 0.44 cm^2. Valve area (Vmax): 0.44 cm^2.   Valve area (Vmean): 0.43 cm^2. - Mitral valve: Severely calcified annulus. , with moderate   involvement of chords. There was trivial regurgitation. Valve   area by continuity equation (using LVOT flow): 1.84 cm^2. - Left atrium: The atrium was mildly dilated. - Tricuspid valve: There was trivial regurgitation. - Pulmonic valve: There was trivial regurgitation. - Pulmonary arteries: PA peak pressure: 43 mm Hg (S).  Patient Profile     82 yo male w/ severe AS (valve area 0.44, PG 66, MG 42), CAD (s/p CABG '98 - LIMA to diag + LAD, SVG to OM1 + OM2), CKDIV and HTN who presents with worsening SOB and LE edema.  Assessment & Plan    # acute on chronic systolic heart failure: Creatinine has improved with diuresis to 3.06. His net out 600 mL. We'll continue IV diuresis today and switch to by mouth tomorrow.  # Severe AS - plan for TAVR in the future, likely contributing to his AS.  # AKI on CKD: likely component of  congestion + poor forward flow - gentle diuresis, improving creatinine  # CAD - ASA, statin  # HTN - metop and lasix  Signed, Edword Cu Meredith Leeds, MD  01/22/2016, 12:28 PM

## 2016-01-23 DIAGNOSIS — I5043 Acute on chronic combined systolic (congestive) and diastolic (congestive) heart failure: Secondary | ICD-10-CM

## 2016-01-23 LAB — BASIC METABOLIC PANEL
Anion gap: 12 (ref 5–15)
BUN: 63 mg/dL — ABNORMAL HIGH (ref 6–20)
CALCIUM: 9.1 mg/dL (ref 8.9–10.3)
CO2: 25 mmol/L (ref 22–32)
CREATININE: 3.17 mg/dL — AB (ref 0.61–1.24)
Chloride: 104 mmol/L (ref 101–111)
GFR calc Af Amer: 18 mL/min — ABNORMAL LOW (ref >60)
GFR calc non Af Amer: 16 mL/min — ABNORMAL LOW (ref >60)
GLUCOSE: 103 mg/dL — AB (ref 65–99)
Potassium: 3.9 mmol/L (ref 3.5–5.1)
Sodium: 141 mmol/L (ref 135–145)

## 2016-01-23 NOTE — Progress Notes (Signed)
Patient Name: Shawn Long Date of Encounter: 01/23/2016  Primary Cardiologist: [facility] Problem List     Active Problems:   Aortic stenosis, severe     Subjective   Feeling much improved with IV lasix. Is able to walk across the room without significant shortness of breath. Feels ready to return home.  Inpatient Medications    Scheduled Meds: . aspirin EC  81 mg Oral Daily  . heparin  5,000 Units Subcutaneous Q8H  . latanoprost  1 drop Both Eyes QHS  . metoprolol  50 mg Oral q morning - 10a  . multivitamin  2 tablet Oral BID  . simvastatin  20 mg Oral q1800  . sodium chloride flush  3 mL Intravenous Q12H   Continuous Infusions:  PRN Meds: sodium chloride, acetaminophen, ondansetron (ZOFRAN) IV, sodium chloride flush   Vital Signs    Vitals:   01/22/16 1129 01/22/16 2148 01/23/16 0443 01/23/16 1138  BP: 110/66 107/62 110/70 104/61  Pulse: 72 80 77 63  Resp: 18 16 17 18   Temp: 97.5 F (36.4 C) 98.2 F (36.8 C) 98.1 F (36.7 C) 97.5 F (36.4 C)  TempSrc: Oral Oral Oral Oral  SpO2: 97% 98% 95% 98%  Weight:   153 lb 4.8 oz (69.5 kg)   Height:        Intake/Output Summary (Last 24 hours) at 01/23/16 1245 Last data filed at 01/23/16 0518  Gross per 24 hour  Intake              340 ml  Output              400 ml  Net              -60 ml   Filed Weights   01/21/16 2235 01/22/16 0142 01/23/16 0443  Weight: 163 lb (73.9 kg) 156 lb 11.2 oz (71.1 kg) 153 lb 4.8 oz (69.5 kg)    Physical Exam    GEN: Well nourished, well developed, in no acute distress.  HEENT: Grossly normal.  Neck: Supple, no JVD, carotid bruits, or masses. Cardiac: RRR, no murmurs, rubs, or gallops. No clubbing, cyanosis, 2+ edema.  Radials/DP/PT 2+ and equal bilaterally.  Respiratory:  Respirations regular and unlabored, clear to auscultation bilaterally. GI: Soft, nontender, nondistended, BS + x 4. MS: no deformity or atrophy. Skin: warm and dry, no rash. Neuro:  Strength and  sensation are intact. Psych: AAOx3.  Normal affect.  Labs    CBC  Recent Labs  01/21/16 1727  WBC 8.6  NEUTROABS 4.6  HGB 10.8*  HCT 33.2*  MCV 104.4*  PLT XX123456   Basic Metabolic Panel  Recent Labs  01/22/16 0809 01/23/16 0507  NA 140 141  K 3.9 3.9  CL 104 104  CO2 24 25  GLUCOSE 130* 103*  BUN 59* 63*  CREATININE 3.06* 3.17*  CALCIUM 9.3 9.1  MG 2.2  --    Liver Function Tests No results for input(s): AST, ALT, ALKPHOS, BILITOT, PROT, ALBUMIN in the last 72 hours. No results for input(s): LIPASE, AMYLASE in the last 72 hours. Cardiac Enzymes No results for input(s): CKTOTAL, CKMB, CKMBINDEX, TROPONINI in the last 72 hours. BNP Invalid input(s): POCBNP D-Dimer No results for input(s): DDIMER in the last 72 hours. Hemoglobin A1C No results for input(s): HGBA1C in the last 72 hours. Fasting Lipid Panel No results for input(s): CHOL, HDL, LDLCALC, TRIG, CHOLHDL, LDLDIRECT in the last 72 hours. Thyroid Function Tests No results for  input(s): TSH, T4TOTAL, T3FREE, THYROIDAB in the last 72 hours.  Invalid input(s): FREET3  Telemetry    Sinus tachycardia with PVCs - Personally Reviewed  ECG    Sinus rhythm, inferior TWI - Personally Reviewed  Radiology    Dg Chest 2 View  Result Date: 01/21/2016 CLINICAL DATA:  Shortness of breath. EXAM: CHEST  2 VIEW COMPARISON:  01/07/2016 chest radiograph. FINDINGS: Stable configuration of sternotomy wires with stable discontinuity in the upper most sternotomy wire. Surgical clips overlie the medial left lower neck and left upper abdomen. Stable cardiomediastinal silhouette with mild cardiomegaly and aortic atherosclerosis. No pneumothorax. Small bilateral pleural effusions appear stable. Borderline mild pulmonary edema. Patchy bibasilar lung opacities. IMPRESSION: 1. Stable mild cardiomegaly and borderline mild pulmonary edema, suggesting mild congestive heart failure. 2. Stable small bilateral pleural effusions . 3.  Patchy bibasilar lung opacities, favor atelectasis . 4. Aortic atherosclerosis. Electronically Signed   By: Ilona Sorrel M.D.   On: 01/21/2016 17:58    Cardiac Studies   TTE  - Left ventricle: The cavity size was mildly dilated. There was   mild focal basal hypertrophy of the septum. Systolic function was   mildly to moderately reduced. The estimated ejection fraction was   45%. Diffuse hypokinesis. There is akinesis of the   basal-midinferior myocardium. Features are consistent with a   pseudonormal left ventricular filling pattern, with concomitant   abnormal relaxation and increased filling pressure (grade 2   diastolic dysfunction). Doppler parameters are consistent with   high ventricular filling pressure. - Aortic valve: Severely calcified annulus. Trileaflet; severely   thickened, moderately calcified leaflets. Valve mobility was   restricted. There was severe stenosis. There was trivial   regurgitation. Mean gradient (S): 42 mm Hg. Peak gradient (S): 66   mm Hg. Valve area (VTI): 0.44 cm^2. Valve area (Vmax): 0.44 cm^2.   Valve area (Vmean): 0.43 cm^2. - Mitral valve: Severely calcified annulus. , with moderate   involvement of chords. There was trivial regurgitation. Valve   area by continuity equation (using LVOT flow): 1.84 cm^2. - Left atrium: The atrium was mildly dilated. - Tricuspid valve: There was trivial regurgitation. - Pulmonic valve: There was trivial regurgitation. - Pulmonary arteries: PA peak pressure: 43 mm Hg (S).  Patient Profile     82 yo male w/ severe AS (valve area 0.44, PG 66, MG 42), CAD (s/p CABG '98 - LIMA to diag + LAD, SVG to OM1 + OM2), CKDIV and HTN who presents with worsening SOB and LE edema.  Assessment & Plan    # acute on chronic systolic heart failure: Creatinine has improved with diuresis to 3.06. His net out 600 mL. Feels much improved after diuresis.  OK for discharge on 20 lasix.  Would have him weigh himself daily and adjust lasix.   Would discharge on 20 lasix daily with instructions to take an extra dose if he gains 2 lbs in a day.  # Severe AS - plan for TAVR in the future, likely contributing to his AS.  # AKI on CKD: likely component of congestion + poor forward flow - gentle diuresis, improving creatinine  # CAD - ASA, statin  # HTN - metop and lasix  Signed, Ciena Sampley Meredith Leeds, MD  01/23/2016, 12:45 PM

## 2016-01-23 NOTE — Progress Notes (Signed)
Pt discharge home. IV and tele removed daughter in law at the bedside discussed HF education and discharge instructions. Questions answered.

## 2016-01-23 NOTE — Discharge Summary (Signed)
Discharge Summary    Patient ID: Shawn Long,  MRN: [id number], DOB/AGE: 03-Dec-1922 82 y.o.  Admit date: 01/21/2016 Discharge date: 01/23/2016  Primary Care Provider: [name] Primary Cardiologist: Dr. [name]  Discharge Diagnoses    Active Problems:   Acute on chronic combined systolic and diastolic HF (heart failure) (HCC)   Aortic stenosis, severe   Allergies Allergies  Allergen Reactions  . Penicillins Rash    Has patient had a PCN reaction causing immediate rash, facial/tongue/throat swelling, SOB or lightheadedness with hypotension: No Has patient had a PCN reaction causing severe rash involving mucus membranes or skin necrosis: No Has patient had a PCN reaction that required hospitalization No Has patient had a PCN reaction occurring within the last 10 years: No If all of the above answers are "NO", then may proceed with Cephalosporin use.    Diagnostic Studies/Procedures    None _____________   History of Present Illness     82 yo male with PMH of severe AS (valve area 0.44, PG 66, MG 42), CAD (s/p CABG '98 - LIMA to diag + LAD, SVG to OM1 + OM2), CKDIV and HTN who presented to with complaints of increasing dyspnea and LE edema. On admission he stated his nephrologist stopped his lasix 2/2 hypotension. States that since that time, he began to develop worsening dyspnea on exertion after walking only short distances. Reports he was restarted on his lasix 3 days prior to admission, but dyspnea continued to worsen. He is planned for a TAVR work up. In the ED his labs showed BNP of 4102, stable electrolytes and Hgb 10.8.   Hospital Course     Consultants: None   He was admitted and given additional doses of IV diuresis with improvement in his breathing. He reported feeling much better the following and had around 1.5L UOP total during this admission. His Cr was noted around his baseline, with a slight increase with IV lasix. He worked well with PT, and was  able to maintain his O2 sats with ambulation.   Total net output was 664ml, but clinical he reported feeling much improved. He was transitioned back to oral lasix 20mg  daily with a plan to take additional dose of 20mg  if weight gain of 2lbs in one day. He was seen and examined by Dr. Curt Long on 11/26 and determined stable for discharge home. I have sent a message to arrange for close outpt follow up in the office with an APP. He is planned for TAVR work up regarding his severe AS. Shawn Long need to follow up with Dr. [name] regarding plans.  _____________  Discharge Vitals Blood pressure 104/61, pulse 63, temperature 97.5 F (36.4 C), temperature source Oral, resp. rate 18, height 5\' 6"  (1.676 m), weight 153 lb 4.8 oz (69.5 kg), SpO2 98 %.  Filed Weights   01/21/16 2235 01/22/16 0142 01/23/16 0443  Weight: 163 lb (73.9 kg) 156 lb 11.2 oz (71.1 kg) 153 lb 4.8 oz (69.5 kg)    Labs & Radiologic Studies    CBC  Recent Labs  01/21/16 1727  WBC 8.6  NEUTROABS 4.6  HGB 10.8*  HCT 33.2*  MCV 104.4*  PLT [id number]   Basic Metabolic Panel  Recent Labs  01/22/16 0809 01/23/16 0507  NA 140 141  K 3.9 3.9  CL 104 104  CO2 24 25  GLUCOSE 130* 103*  BUN 59* 63*  CREATININE 3.06* 3.17*  CALCIUM 9.3 9.1  MG 2.2  --  Liver Function Tests No results for input(s): AST, ALT, ALKPHOS, BILITOT, PROT, ALBUMIN in the last 72 hours. No results for input(s): LIPASE, AMYLASE in the last 72 hours. Cardiac Enzymes No results for input(s): CKTOTAL, CKMB, CKMBINDEX, TROPONINI in the last 72 hours. BNP Invalid input(s): POCBNP D-Dimer No results for input(s): DDIMER in the last 72 hours. Hemoglobin A1C No results for input(s): HGBA1C in the last 72 hours. Fasting Lipid Panel No results for input(s): CHOL, HDL, LDLCALC, TRIG, CHOLHDL, LDLDIRECT in the last 72 hours. Thyroid Function Tests No results for input(s): TSH, T4TOTAL, T3FREE, THYROIDAB in the last 72 hours.  Invalid input(s):  FREET3 _____________  Dg Chest 2 View  Result Date: 01/21/2016 CLINICAL DATA:  Shortness of breath. EXAM: CHEST  2 VIEW COMPARISON:  01/07/2016 chest radiograph. FINDINGS: Stable configuration of sternotomy wires with stable discontinuity in the upper most sternotomy wire. Surgical clips overlie the medial left lower neck and left upper abdomen. Stable cardiomediastinal silhouette with mild cardiomegaly and aortic atherosclerosis. No pneumothorax. Small bilateral pleural effusions appear stable. Borderline mild pulmonary edema. Patchy bibasilar lung opacities. IMPRESSION: 1. Stable mild cardiomegaly and borderline mild pulmonary edema, suggesting mild congestive heart failure. 2. Stable small bilateral pleural effusions . 3. Patchy bibasilar lung opacities, favor atelectasis . 4. Aortic atherosclerosis. Electronically Signed   By: Ilona Sorrel M.D.   On: 01/21/2016 17:58   Disposition   Pt is being discharged home today in good condition.  Follow-up Plans & Appointments    Follow-up [place] III, MD Follow up.   Specialty:  Cardiology Why:  The office Shawn Long call you with a follow up appt.  Contact information: [id number] [address] [phone]          Discharge Instructions    (HEART FAILURE PATIENTS) Call MD:  Anytime you have any of the following symptoms: 1) 3 pound weight gain in 24 hours or 5 pounds in 1 week 2) shortness of breath, with or without a dry hacking cough 3) swelling in the hands, feet or stomach 4) if you have to sleep on extra pillows at night in order to breathe.    Complete by:  As directed    Diet - low sodium heart healthy    Complete by:  As directed    Discharge instructions    Complete by:  As directed    Please resume your 20mg  lasix daily. You can take an extra 20mg  if you gain 2lbs in a day. The office Shawn Long call you with follow up this week.   Increase activity slowly    Complete by:  As directed        Discharge Medications   Discharge Medication List as of 01/23/2016  4:17 PM    CONTINUE these medications which have NOT CHANGED   Details  aspirin 81 MG tablet Take 81 mg by mouth daily.  , Until Discontinued, Historical Med    furosemide (LASIX) 20 MG tablet Take 20 mg by mouth daily. , Starting 08/21/2013, Until Discontinued, Historical Med    latanoprost (XALATAN) 0.005 % ophthalmic solution Place 1 drop into both eyes at bedtime. , Historical Med    Menthol, Topical Analgesic, (BIOFREEZE EX) Apply 1 application topically every morning., Historical Med    metoprolol (LOPRESSOR) 50 MG tablet Take 50 mg by mouth every evening. Verified lopressor once daily , Historical Med    mineral oil-hydrophilic petrolatum (AQUAPHOR) ointment Apply 1 application topically daily  as needed for dry skin., Historical Med    Misc Natural Products (OSTEO BI-FLEX ADV JOINT SHIELD PO) Take 1 tablet by mouth daily. , Until Discontinued, Historical Med    Multiple Vitamins-Minerals (MACUVITE EYE CARE) TABS Take 2 tablets by mouth 2 (two) times daily. , Historical Med    SIMBRINZA 1-0.2 % SUSP Place 1 drop into both eyes every morning. , Starting Fri 05/28/2015, Historical Med    simvastatin (ZOCOR) 20 MG tablet Take 20 mg by mouth daily at 6 PM., Historical Med          Outstanding Labs/Studies   BMET at office visit.   Duration of Discharge Encounter   Greater than 30 minutes including physician time.  Signed, Reino Bellis NP-C 01/23/2016, 4:40 PM.  I have seen and examined this patient with Reino Bellis.  Agree with above, note added to reflect my findings.  On exam, regular rhythm, 2/6 systolic murmur, absent S2, lungs clear. Presented with SOB found to be in heart failure. thought likely due to his severe aortic stenosis. Did well with diuresis and now able to ambulate without SOB.  Plan for discharge today and follow up in clinic for further workup and possible TAVR.    Travas Schexnayder  M. Labib Cwynar MD 01/23/2016 4:54 PM

## 2016-01-24 ENCOUNTER — Encounter: Payer: Self-pay | Admitting: Cardiology

## 2016-01-24 ENCOUNTER — Ambulatory Visit (INDEPENDENT_AMBULATORY_CARE_PROVIDER_SITE_OTHER): Payer: Medicare Other | Admitting: Cardiology

## 2016-01-24 ENCOUNTER — Telehealth: Payer: Self-pay | Admitting: Interventional Cardiology

## 2016-01-24 ENCOUNTER — Encounter (INDEPENDENT_AMBULATORY_CARE_PROVIDER_SITE_OTHER): Payer: Self-pay

## 2016-01-24 ENCOUNTER — Encounter: Payer: Self-pay | Admitting: *Deleted

## 2016-01-24 VITALS — BP 102/50 | HR 86 | Ht 66.0 in | Wt 158.8 lb

## 2016-01-24 DIAGNOSIS — N184 Chronic kidney disease, stage 4 (severe): Secondary | ICD-10-CM

## 2016-01-24 DIAGNOSIS — R0602 Shortness of breath: Secondary | ICD-10-CM

## 2016-01-24 DIAGNOSIS — I25810 Atherosclerosis of coronary artery bypass graft(s) without angina pectoris: Secondary | ICD-10-CM

## 2016-01-24 DIAGNOSIS — I5031 Acute diastolic (congestive) heart failure: Secondary | ICD-10-CM

## 2016-01-24 DIAGNOSIS — I350 Nonrheumatic aortic (valve) stenosis: Secondary | ICD-10-CM | POA: Diagnosis not present

## 2016-01-24 DIAGNOSIS — I10 Essential (primary) hypertension: Secondary | ICD-10-CM

## 2016-01-24 NOTE — Patient Instructions (Signed)
Medication Instructions:    Your physician recommends that you continue on your current medications as directed. Please refer to the Current Medication list given to you today.    If you need a refill on your cardiac medications before your next appointment, please call your pharmacy.  Labwork: NONE ORDERED  TODAY    Testing/Procedures:   SEE LETTER FOR  RIGHT AND LEFT HEART CATH ON 02/03/16..   Follow-Up: BASED UPON RESULTS FOR CATH    Any Other Special Instructions Will Be Listed Below (If Applicable).

## 2016-01-24 NOTE — Telephone Encounter (Signed)
Spoke with daughter and moved appt up to today.  Daughter appreciative for assistance.

## 2016-01-24 NOTE — Progress Notes (Signed)
Cardiology Office Note   Date:  01/24/2016   ID:  Shawn Long, DOB 01-29-1923, MRN [id number]  PCP:  [name], MD  Cardiologist:  Dr. [name]     Chief Complaint  Patient presents with  . Hospitalization Follow-up    follow up      History of Present Illness: Shawn Long is a 82 y.o. male who presents for post hospital for CHF due to CHF.     He has a PMH of severe AS (valve area 0.44, PG 66, MG 42), CAD (s/p CABG '98 - [facility] to diag + LAD, SVG to OM1 + OM2), CKDIV and HTN who presented to with complaints of increasing dyspnea and LE edema. On admission he stated his nephrologist Dr. Zigmund Gottron stopped his lasix 2/2 hypotension and increased renal insuff.  Lasix was resumed but since that time, he began to develop worsening dyspnea on exertion after walking only short distances.  Even with resuming lasix dyspnea continued to worsen. He planned for a TAVR work up. In the ED his labs showed BNP of 4102, with cr of 3.21 up from 2.32 and BUN 61 up from 50.  GFR at 16  and Hgb 10.8.   He was admitted 01/21/16 and given additional doses of IV diuresis with improvement in his breathing. He reported feeling much better the following and had around 1.5L UOP total during the admission. His Cr was noted around his baseline, with a slight increase with IV lasix. He worked well with PT, and was able to maintain his O2 sats with ambulation.   Total net output was 616ml, but clinical he reported feeling much improved. He was transitioned back to oral lasix 20mg  daily with a plan to take additional dose of 20mg  if weight gain of 2lbs in one day and placed on 1 L of liquids per day.    ECHO Study Conclusions 01/04/16 - Left ventricle: The cavity size was mildly dilated. There was   mild focal basal hypertrophy of the septum. Systolic function was   mildly to moderately reduced. The estimated ejection fraction was   45%. Diffuse hypokinesis. There is akinesis of the   basal-midinferior  myocardium. Features are consistent with a   pseudonormal left ventricular filling pattern, with concomitant   abnormal relaxation and increased filling pressure (grade 2   diastolic dysfunction). Doppler parameters are consistent with   high ventricular filling pressure. - Aortic valve: Severely calcified annulus. Trileaflet; severely   thickened, moderately calcified leaflets. Valve mobility was   restricted. There was severe stenosis. There was trivial   regurgitation. Mean gradient (S): 42 mm Hg. Peak gradient (S): 66   mm Hg. Valve area (VTI): 0.44 cm^2. Valve area (Vmax): 0.44 cm^2.   Valve area (Vmean): 0.43 cm^2. - Mitral valve: Severely calcified annulus. , with moderate   involvement of chords. There was trivial regurgitation. Valve   area by continuity equation (using LVOT flow): 1.84 cm^2. - Left atrium: The atrium was mildly dilated. - Tricuspid valve: There was trivial regurgitation. - Pulmonic valve: There was trivial regurgitation. - Pulmonary arteries: PA peak pressure: 43 mm Hg (S).  Impressions:  - The right ventricular systolic pressure was increased consistent   with moderate pulmonary hypertension.   Today, 1 day after discharge pt is much improved, can walk around house without SOB.  No chest pain.    Dr. [name] has seen and discussed at length TAVR and studies needed prior to the actual procedure.  This includes  Rt and Lt heart cath,  Then CT angio depending on cardiac cath and consult with Dr. Angelena Form.  And even prior to cardiac cath pt will see Dr. Pearson Grippe at [facility] for further renal eval.  All of these studies could have the potential of acute renal failure leading to dialysis.  For now will continue lasix 20 mg daily.  Dr. [name] believes without the lasix he will be back in HF.      Past Medical History:  Diagnosis Date  . Aortic valve disorders   . CAD (coronary artery disease) 1998   s/p cabg x4  . Carotid arterial disease ([place])   .  Chronic kidney disease (CKD), stage IV (severe) ([place])   . Heart murmur   . HTN (hypertension)   . Hyperlipidemia   . Macular degeneration   . Severe aortic stenosis     Past Surgical History:  Procedure Laterality Date  . CAROTID ENDARTERECTOMY, LEFT  03/11/1997   Dr. Kellie Simmering  . CORONARY ARTERY BYPASS GRAFT  01/15/97   CABGx4 by Dr Redmond Pulling ([facility] to diag + LAD, SVG to OM1 + OM2)  . left knee arthroscopic sugery    . right pointer finger surgery secondary to industrial accident       Current Outpatient Prescriptions  Medication Sig Dispense Refill  . aspirin 81 MG tablet Take 81 mg by mouth daily.      . furosemide (LASIX) 20 MG tablet Take 20 mg by mouth daily.     Marland Kitchen latanoprost (XALATAN) 0.005 % ophthalmic solution Place 1 drop into both eyes at bedtime.     . Menthol, Topical Analgesic, (BIOFREEZE EX) Apply 1 application topically every morning.    . metoprolol (LOPRESSOR) 50 MG tablet Take 50 mg by mouth every evening. Verified lopressor once daily     . mineral oil-hydrophilic petrolatum (AQUAPHOR) ointment Apply 1 application topically daily as needed for dry skin.    . Misc Natural Products (OSTEO BI-FLEX ADV JOINT SHIELD PO) Take 1 tablet by mouth daily.     . Multiple Vitamins-Minerals (MACUVITE EYE CARE) TABS Take 2 tablets by mouth 2 (two) times daily.     Marland Kitchen SIMBRINZA 1-0.2 % SUSP Place 1 drop into both eyes every morning.     . simvastatin (ZOCOR) 20 MG tablet Take 20 mg by mouth daily at 6 PM.     No current facility-administered medications for this visit.     Allergies:   Penicillins    Social History:  The patient  reports that he has never smoked. He has never used smokeless tobacco. He reports that he drinks alcohol. He reports that he does not use drugs.   Family History:  The patient's family history includes Cancer - Ovarian in his mother.    ROS:  General:no colds or fevers, stable weight he has new scales at home Skin:no rashes or ulcers HEENT:no  blurred vision, no congestion CV:see HPI PUL:see HPI GI:no diarrhea constipation or melena, no indigestion GU:no hematuria, no dysuria MS:no joint pain, no claudication Neuro:no syncope, no lightheadedness Endo:no diabetes, no thyroid disease  Wt Readings from Last 3 Encounters:  01/24/16 158 lb 12.8 oz (72 kg)  01/23/16 153 lb 4.8 oz (69.5 kg)  12/07/15 163 lb 6.4 oz (74.1 kg)     PHYSICAL EXAM: VS:  BP (!) 102/50   Pulse 86   Ht 5\' 6"  (1.676 m)   Wt 158 lb 12.8 oz (72 kg)   BMI 25.63 kg/m  ,  BMI Body mass index is 25.63 kg/m. General:Pleasant affect, NAD Skin:Warm and dry, brisk capillary refill HEENT:normocephalic, sclera clear, mucus membranes moist Neck:supple, no JVD, no bruits  Heart:S1S2 RRR with 3/6 squeaky sytolic murmur, no gallup, rub or click radiates to abd.  Lungs:clear without rales, rhonchi, or wheezes [id number], non tender, + BS, do not palpate liver spleen or masses Ext:Lt 1+ lower ext edema mostly at ankles and Rt mild trace, 2+ pedal pulses, 2+ radial pulses Neuro:alert and oriented X 3, MAE, follows commands, + facial symmetry    EKG:  EKG is not ordered today.    Recent Labs: 01/07/2016: ALT 16 01/21/2016: B Natriuretic Peptide 4,102.6; Hemoglobin 10.8; Platelets 183 01/22/2016: Magnesium 2.2 01/23/2016: BUN 63; Creatinine, Ser 3.17; Potassium 3.9; Sodium 141    Lipid Panel No results found for: CHOL, TRIG, HDL, CHOLHDL, VLDL, LDLCALC, LDLDIRECT     Other studies Reviewed: Additional studies/ records that were reviewed today include: echo as above.   ASSESSMENT AND PLAN:  1.  Acute on chronic diastolic heart failure secondary to holding lasix.  Now without DOE after IV lasix in the hospital. .  2. Severe AS- possible candidate for TAVR - will plan for Rt and Lt heart cath next week with Dr. [name].  If Dr. Joelyn Oms agrees.  Difficult situation between HF with AS and CKD. .    3. CKD- 4 has been acute on chronic.  More stable and will  need to continue diuretic, and if needed will need to increase.    4. CAD with hx of CABG 1998 .[facility] LAD/Diag and SVG OM 1-2), no angina  5. HTN stable to low.   If pt proceeds to cath, pt will follow up with Dr. Angelena Form per Dr. [name]  Again depending on results.   Current medicines are reviewed with the patient today.  The patient Has no concerns regarding medicines.  The following changes have been made:  See above Labs/ tests ordered today include:see above  Disposition:   FU:  see above  Signed, Cecilie Kicks, NP  01/24/2016 1:33 PM    [facility], [place], [place], [place] Phone: [phone]; Fax: [phone]

## 2016-01-24 NOTE — Telephone Encounter (Signed)
New message    Daughter calling patient was discharge from hospital on yesterday afternoon from [facility] .  Son spoke with on -call APP was advise to set up an appt to see Dr. [name]   Offer appt on  11.30 with Dr. [name]- daughter was told appt on Tuesday or Wednesday  Appt made on 11.28  With Cecilie Kicks.

## 2016-01-25 ENCOUNTER — Encounter: Payer: Medicare Other | Admitting: Cardiology

## 2016-01-26 ENCOUNTER — Telehealth: Payer: Self-pay | Admitting: Interventional Cardiology

## 2016-01-26 DIAGNOSIS — N184 Chronic kidney disease, stage 4 (severe): Secondary | ICD-10-CM | POA: Diagnosis not present

## 2016-01-26 NOTE — Telephone Encounter (Signed)
Spoke with pt and made him aware of cath being cancelled.  Called cath lab and cancelled appt.  Advised pt that Dr. [name] plans to speak with him tomorrow.  Pt verbalized understanding and was in agreement with this plan.

## 2016-01-26 NOTE — Telephone Encounter (Signed)
Belva Crome, MD  Loren Racer, LPN        Please cancel plans for cath on Mr. Savoca.  Let him know that I will speak with him tomorrow.  Nephrology feels we should do the best we can with medications.  After speaking with them, I also agree with this.  Please remind me to call him.    Left message to call back.

## 2016-01-27 NOTE — Telephone Encounter (Signed)
I had a nice conversation with Shawn Long this morning. I recommended that we not proceed with consideration of TAVR, because of his age, severe kidney impairment, physical deconditioning, and recommendations from our kidney service.  My recommendation assumes that he will develop dialysis dependent kidney failure in the process of workup and therapy. This may not be true, however his long-term prognosis would be severely limited if he only had end-stage kidney disease. Dr. Joelyn Oms does not feel he would be a candidate for long term dialysis.  I offered Shawn Long an opportunity to be referred for second opinion. He will consider this.  At this point, TAVR is not a treatment option within our system, because of severe comorbidities.

## 2016-01-28 ENCOUNTER — Telehealth: Payer: Self-pay | Admitting: Interventional Cardiology

## 2016-01-28 NOTE — Telephone Encounter (Signed)
New message       Pt request to talk to the nurse.  Not sure what he wants

## 2016-01-28 NOTE — Telephone Encounter (Signed)
Attempted to call pt back.  Phone rang several times with no answer and no VM.  Unable to LVM.

## 2016-01-31 NOTE — Telephone Encounter (Signed)
Spoke with pt and he wanted to know about f/u with Dr. [name] since he is no longer going through with the process for TAVR.  Dr. [name] said he would like to see pt in Jan.  Scheduled pt for 03/28/16 with Dr. [name].  Pt verbalized understanding and was in agreement with this plan.

## 2016-02-03 ENCOUNTER — Ambulatory Visit (HOSPITAL_COMMUNITY): Admit: 2016-02-03 | Payer: Medicare Other | Admitting: Interventional Cardiology

## 2016-02-03 ENCOUNTER — Encounter (HOSPITAL_COMMUNITY): Payer: Self-pay

## 2016-02-03 DIAGNOSIS — J069 Acute upper respiratory infection, unspecified: Secondary | ICD-10-CM | POA: Diagnosis not present

## 2016-02-03 SURGERY — RIGHT/LEFT HEART CATH AND CORONARY ANGIOGRAPHY

## 2016-02-10 ENCOUNTER — Telehealth: Payer: Self-pay | Admitting: Interventional Cardiology

## 2016-02-10 DIAGNOSIS — C4359 Malignant melanoma of other part of trunk: Secondary | ICD-10-CM | POA: Diagnosis not present

## 2016-02-10 NOTE — Telephone Encounter (Signed)
New Message:    Does pt needs to be pre medicated before dental cleaning? Please fax to [phone].

## 2016-02-10 NOTE — Telephone Encounter (Signed)
He does not require premedication prior to dental work.

## 2016-02-11 NOTE — Telephone Encounter (Signed)
Faxed to requesting office. 

## 2016-02-17 DIAGNOSIS — H35053 Retinal neovascularization, unspecified, bilateral: Secondary | ICD-10-CM | POA: Diagnosis not present

## 2016-02-17 DIAGNOSIS — H353124 Nonexudative age-related macular degeneration, left eye, advanced atrophic with subfoveal involvement: Secondary | ICD-10-CM | POA: Diagnosis not present

## 2016-02-17 DIAGNOSIS — H353212 Exudative age-related macular degeneration, right eye, with inactive choroidal neovascularization: Secondary | ICD-10-CM | POA: Diagnosis not present

## 2016-02-17 DIAGNOSIS — H43811 Vitreous degeneration, right eye: Secondary | ICD-10-CM | POA: Diagnosis not present

## 2016-03-09 DIAGNOSIS — N184 Chronic kidney disease, stage 4 (severe): Secondary | ICD-10-CM | POA: Diagnosis not present

## 2016-03-09 DIAGNOSIS — I10 Essential (primary) hypertension: Secondary | ICD-10-CM | POA: Diagnosis not present

## 2016-03-09 DIAGNOSIS — I350 Nonrheumatic aortic (valve) stenosis: Secondary | ICD-10-CM | POA: Diagnosis not present

## 2016-03-28 ENCOUNTER — Ambulatory Visit (INDEPENDENT_AMBULATORY_CARE_PROVIDER_SITE_OTHER): Payer: Medicare Other | Admitting: Interventional Cardiology

## 2016-03-28 ENCOUNTER — Encounter: Payer: Self-pay | Admitting: Interventional Cardiology

## 2016-03-28 VITALS — BP 100/64 | HR 82 | Ht 66.0 in | Wt 158.8 lb

## 2016-03-28 DIAGNOSIS — I25768 Atherosclerosis of bypass graft of coronary artery of transplanted heart with other forms of angina pectoris: Secondary | ICD-10-CM | POA: Diagnosis not present

## 2016-03-28 DIAGNOSIS — I10 Essential (primary) hypertension: Secondary | ICD-10-CM | POA: Diagnosis not present

## 2016-03-28 DIAGNOSIS — I5043 Acute on chronic combined systolic (congestive) and diastolic (congestive) heart failure: Secondary | ICD-10-CM

## 2016-03-28 DIAGNOSIS — I209 Angina pectoris, unspecified: Secondary | ICD-10-CM

## 2016-03-28 DIAGNOSIS — I350 Nonrheumatic aortic (valve) stenosis: Secondary | ICD-10-CM

## 2016-03-28 DIAGNOSIS — N184 Chronic kidney disease, stage 4 (severe): Secondary | ICD-10-CM

## 2016-03-28 NOTE — Progress Notes (Signed)
Cardiology Office Note    Date:  03/28/2016   ID:  Shawn Long, DOB December 20, 1922, MRN [id number]  PCP:  [name], MD  Cardiologist: Sinclair Grooms, MD   Chief Complaint  Patient presents with  . Cardiac Valve Problem    History of Present Illness:  Shawn Long is a 83 y.o. male who presents for Critical aortic stenosis. This is complicated by being elderly and frail, stage V chronic kidney disease, CAD, and hypertension. Critical aortic stenosis was evaluated with idea of TAVR but kidney failure led to a decision for conservative medical management.  He is doing relatively well but complains of progressive exertional fatigue. He does not have shortness of breath or complaints at rest. He denies angina. He has not had syncope. He is content with the ultimate decision for conservative medical therapy. I did discuss with him the possibility of [facility] but he chose against.   Past Medical History:  Diagnosis Date  . Aortic valve disorders   . CAD (coronary artery disease) 1998   s/p cabg x4  . Carotid arterial disease ([place])   . Chronic kidney disease (CKD), stage IV (severe) ([place])   . Heart murmur   . HTN (hypertension)   . Hyperlipidemia   . Macular degeneration   . Severe aortic stenosis     Past Surgical History:  Procedure Laterality Date  . CAROTID ENDARTERECTOMY, LEFT  03/11/1997   Dr. Kellie Simmering  . CORONARY ARTERY BYPASS GRAFT  01/15/97   CABGx4 by Dr Redmond Pulling ([facility] to diag + LAD, SVG to OM1 + OM2)  . left knee arthroscopic sugery    . right pointer finger surgery secondary to industrial accident      Current Medications: Outpatient Medications Prior to Visit  Medication Sig Dispense Refill  . aspirin 81 MG tablet Take 81 mg by mouth daily.      . furosemide (LASIX) 20 MG tablet Take 20 mg by mouth daily.     Marland Kitchen latanoprost (XALATAN) 0.005 % ophthalmic solution Place 1 drop into both eyes at bedtime.     . Menthol,  Topical Analgesic, (BIOFREEZE EX) Apply 1 application topically every morning.    . metoprolol (LOPRESSOR) 50 MG tablet Take 50 mg by mouth every evening. Verified lopressor once daily     . mineral oil-hydrophilic petrolatum (AQUAPHOR) ointment Apply 1 application topically daily as needed for dry skin.    . Misc Natural Products (OSTEO BI-FLEX ADV JOINT SHIELD PO) Take 1 tablet by mouth daily.     . Multiple Vitamins-Minerals (MACUVITE EYE CARE) TABS Take 2 tablets by mouth 2 (two) times daily.     Marland Kitchen SIMBRINZA 1-0.2 % SUSP Place 1 drop into both eyes every morning.     . simvastatin (ZOCOR) 20 MG tablet Take 20 mg by mouth daily at 6 PM.     No facility-administered medications prior to visit.      Allergies:   Penicillins   Social History   Social History  . Marital status: Married    Spouse name: N/A  . Number of children: 3  . Years of education: N/A   Occupational History  . unemployed, retired     Social History Main Topics  . Smoking status: Never Smoker  . Smokeless tobacco: Never Used  . Alcohol use Yes     Comment: 4oz wine per day  . Drug use: No  . Sexual activity: Not Asked  Other Topics Concern  . None   Social History Narrative  . None     Family History:  The patient's family history includes Cancer - Ovarian in his mother.   ROS:   Please see the history of present illness.    Back discomfort, weak legs, easy bruising, otherwise no complaints. Sleeping well.  All other systems reviewed and are negative.   PHYSICAL EXAM:   VS:  BP 100/64 (BP Location: Right Arm)   Pulse 82   Ht 5\' 6"  (1.676 m)   Wt 158 lb 12.8 oz (72 kg)   BMI 25.63 kg/m    GEN: Well nourished, well developed, in no acute distress  HEENT: normal  Neck: no JVD, carotid bruits, or masses Cardiac: RRR; 4/6 crescendo decrescendo systolic murmur consistent with aortic stenosis. There are no rubs, or gallops,no edema  Respiratory:  clear to auscultation bilaterally, normal work of  breathing GI: soft, nontender, nondistended, + BS MS: no deformity or atrophy  Skin: warm and dry, no rash Neuro:  Alert and Oriented x 3, Strength and sensation are intact Psych: euthymic mood, full affect  Wt Readings from Last 3 Encounters:  03/28/16 158 lb 12.8 oz (72 kg)  01/24/16 158 lb 12.8 oz (72 kg)  01/23/16 153 lb 4.8 oz (69.5 kg)      Studies/Labs Reviewed:   EKG:  EKG  Tracing is not repeated  Recent Labs: 01/07/2016: ALT 16 01/21/2016: B Natriuretic Peptide 4,102.6; Hemoglobin 10.8; Platelets 183 01/22/2016: Magnesium 2.2 01/23/2016: BUN 63; Creatinine, Ser 3.17; Potassium 3.9; Sodium 141   Lipid Panel No results found for: CHOL, TRIG, HDL, CHOLHDL, VLDL, LDLCALC, LDLDIRECT  Additional studies/ records that were reviewed today include:  No new data.    ASSESSMENT:    1. Severe aortic stenosis   2. Essential hypertension   3. 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 Acute on chronic combined systolic and diastolic HF (heart failure) ([place])   5. Chronic kidney disease (CKD), stage IV (severe) ([facility])      PLAN:  In order of problems listed above:  1. Symptomatic critical aortic stenosis. Conservative medical management is the treatment of choice after consideration of options. 2. Blood pressure is slightly lower than on last occasion, related to severity of aortic stenosis. 3. Asymptomatic 4. Mildly volume overloaded but given relatively low blood pressure, additional diuresis will not be attempted at this time. 5. Will now be seeing Dr. Shane Crutch on a chronic basis for kidney failure.    Medication Adjustments/Labs and Tests Ordered: Current medicines are reviewed at length with the patient today.  Concerns regarding medicines are outlined above.  Medication changes, Labs and Tests ordered today are listed in the Patient Instructions below. Patient Instructions  Medication Instructions:   None  Labwork: None  Testing/Procedures: None  Follow-Up: Your physician recommends that you schedule a follow-up appointment in: 3 months with Dr. [name].   Any Other Special Instructions Will Be Listed Below (If Applicable).  Weigh yourself daily.  If you see your weight trending up or have any swelling please contact our office.     If you need a refill on your cardiac medications before your next appointment, please call your pharmacy.      Signed, Sinclair Grooms, MD  03/28/2016 12:32 PM    [facility] [place], [place], [place]  [address] Phone: [phone]; Fax: [phone]

## 2016-03-28 NOTE — Patient Instructions (Signed)
Medication Instructions:  None  Labwork: None  Testing/Procedures: None  Follow-Up: Your physician recommends that you schedule a follow-up appointment in: 3 months with Dr. [name].   Any Other Special Instructions Will Be Listed Below (If Applicable).  Weigh yourself daily.  If you see your weight trending up or have any swelling please contact our office.     If you need a refill on your cardiac medications before your next appointment, please call your pharmacy.

## 2016-04-03 DIAGNOSIS — J069 Acute upper respiratory infection, unspecified: Secondary | ICD-10-CM | POA: Diagnosis not present

## 2016-04-20 DIAGNOSIS — C44320 Squamous cell carcinoma of skin of unspecified parts of face: Secondary | ICD-10-CM | POA: Diagnosis not present

## 2016-04-20 DIAGNOSIS — L570 Actinic keratosis: Secondary | ICD-10-CM | POA: Diagnosis not present

## 2016-04-28 ENCOUNTER — Emergency Department (HOSPITAL_COMMUNITY): Payer: No Typology Code available for payment source

## 2016-04-28 ENCOUNTER — Inpatient Hospital Stay (HOSPITAL_COMMUNITY)
Admission: EM | Admit: 2016-04-28 | Discharge: 2016-04-30 | DRG: 307 | Disposition: A | Discharge status: Home / Self Care | Payer: No Typology Code available for payment source | Attending: Internal Medicine | Admitting: Internal Medicine

## 2016-04-28 ENCOUNTER — Encounter (HOSPITAL_COMMUNITY): Payer: Self-pay | Admitting: *Deleted

## 2016-04-28 DIAGNOSIS — Y92410 Unspecified street and highway as the place of occurrence of the external cause: Secondary | ICD-10-CM

## 2016-04-28 DIAGNOSIS — I25810 Atherosclerosis of coronary artery bypass graft(s) without angina pectoris: Secondary | ICD-10-CM | POA: Diagnosis not present

## 2016-04-28 DIAGNOSIS — S0003XA Contusion of scalp, initial encounter: Secondary | ICD-10-CM | POA: Diagnosis not present

## 2016-04-28 DIAGNOSIS — S0083XA Contusion of other part of head, initial encounter: Secondary | ICD-10-CM | POA: Diagnosis not present

## 2016-04-28 DIAGNOSIS — I5042 Chronic combined systolic (congestive) and diastolic (congestive) heart failure: Secondary | ICD-10-CM | POA: Diagnosis present

## 2016-04-28 DIAGNOSIS — Z951 Presence of aortocoronary bypass graft: Secondary | ICD-10-CM

## 2016-04-28 DIAGNOSIS — I132 Hypertensive heart and chronic kidney disease with heart failure and with stage 5 chronic kidney disease, or end stage renal disease: Secondary | ICD-10-CM | POA: Diagnosis present

## 2016-04-28 DIAGNOSIS — D649 Anemia, unspecified: Secondary | ICD-10-CM | POA: Diagnosis present

## 2016-04-28 DIAGNOSIS — S0003XD Contusion of scalp, subsequent encounter: Secondary | ICD-10-CM | POA: Diagnosis not present

## 2016-04-28 DIAGNOSIS — R55 Syncope and collapse: Secondary | ICD-10-CM | POA: Diagnosis not present

## 2016-04-28 DIAGNOSIS — Z880 Allergy status to penicillin: Secondary | ICD-10-CM

## 2016-04-28 DIAGNOSIS — I350 Nonrheumatic aortic (valve) stenosis: Secondary | ICD-10-CM | POA: Diagnosis not present

## 2016-04-28 DIAGNOSIS — Z79899 Other long term (current) drug therapy: Secondary | ICD-10-CM

## 2016-04-28 DIAGNOSIS — E785 Hyperlipidemia, unspecified: Secondary | ICD-10-CM | POA: Diagnosis present

## 2016-04-28 DIAGNOSIS — R0602 Shortness of breath: Secondary | ICD-10-CM | POA: Diagnosis not present

## 2016-04-28 DIAGNOSIS — E875 Hyperkalemia: Secondary | ICD-10-CM | POA: Diagnosis not present

## 2016-04-28 DIAGNOSIS — I10 Essential (primary) hypertension: Secondary | ICD-10-CM | POA: Diagnosis present

## 2016-04-28 DIAGNOSIS — H3530 Unspecified macular degeneration: Secondary | ICD-10-CM | POA: Diagnosis present

## 2016-04-28 DIAGNOSIS — Z7982 Long term (current) use of aspirin: Secondary | ICD-10-CM

## 2016-04-28 DIAGNOSIS — N185 Chronic kidney disease, stage 5: Secondary | ICD-10-CM | POA: Diagnosis not present

## 2016-04-28 DIAGNOSIS — Z7189 Other specified counseling: Secondary | ICD-10-CM

## 2016-04-28 DIAGNOSIS — Z515 Encounter for palliative care: Secondary | ICD-10-CM

## 2016-04-28 HISTORY — DX: Anemia, unspecified: D64.9

## 2016-04-28 HISTORY — DX: Chronic combined systolic (congestive) and diastolic (congestive) heart failure: I50.42

## 2016-04-28 LAB — CBC WITH DIFFERENTIAL/PLATELET
BASOS PCT: 1 %
Basophils Absolute: 0 10*3/uL (ref 0.0–0.1)
EOS ABS: 0 10*3/uL (ref 0.0–0.7)
Eosinophils Relative: 0 %
HCT: 33 % — ABNORMAL LOW (ref 39.0–52.0)
HEMOGLOBIN: 10.9 g/dL — AB (ref 13.0–17.0)
Lymphocytes Relative: 10 %
Lymphs Abs: 0.7 10*3/uL (ref 0.7–4.0)
MCH: 34.4 pg — ABNORMAL HIGH (ref 26.0–34.0)
MCHC: 33 g/dL (ref 30.0–36.0)
MCV: 104.1 fL — ABNORMAL HIGH (ref 78.0–100.0)
Monocytes Absolute: 0.7 10*3/uL (ref 0.1–1.0)
Monocytes Relative: 9 %
NEUTROS PCT: 80 %
Neutro Abs: 5.7 10*3/uL (ref 1.7–7.7)
Platelets: 198 10*3/uL (ref 150–400)
RBC: 3.17 MIL/uL — AB (ref 4.22–5.81)
RDW: 15.5 % (ref 11.5–15.5)
WBC: 7.2 10*3/uL (ref 4.0–10.5)

## 2016-04-28 LAB — I-STAT CHEM 8, ED
BUN: 75 mg/dL — AB (ref 6–20)
CHLORIDE: 102 mmol/L (ref 101–111)
Calcium, Ion: 1.1 mmol/L — ABNORMAL LOW (ref 1.15–1.40)
Creatinine, Ser: 3 mg/dL — ABNORMAL HIGH (ref 0.61–1.24)
Glucose, Bld: 125 mg/dL — ABNORMAL HIGH (ref 65–99)
HEMATOCRIT: 34 % — AB (ref 39.0–52.0)
Hemoglobin: 11.6 g/dL — ABNORMAL LOW (ref 13.0–17.0)
Potassium: 6 mmol/L — ABNORMAL HIGH (ref 3.5–5.1)
SODIUM: 137 mmol/L (ref 135–145)
TCO2: 28 mmol/L (ref 0–100)

## 2016-04-28 LAB — I-STAT TROPONIN, ED: Troponin i, poc: 0.07 ng/mL (ref 0.00–0.08)

## 2016-04-28 LAB — BASIC METABOLIC PANEL
Anion gap: 9 (ref 5–15)
BUN: 53 mg/dL — AB (ref 6–20)
CO2: 27 mmol/L (ref 22–32)
Calcium: 9.6 mg/dL (ref 8.9–10.3)
Chloride: 102 mmol/L (ref 101–111)
Creatinine, Ser: 3.02 mg/dL — ABNORMAL HIGH (ref 0.61–1.24)
GFR calc Af Amer: 19 mL/min — ABNORMAL LOW (ref >60)
GFR, EST NON AFRICAN AMERICAN: 17 mL/min — AB (ref >60)
Glucose, Bld: 128 mg/dL — ABNORMAL HIGH (ref 65–99)
POTASSIUM: 4.4 mmol/L (ref 3.5–5.1)
SODIUM: 138 mmol/L (ref 135–145)

## 2016-04-28 LAB — TROPONIN I: TROPONIN I: 0.15 ng/mL — AB (ref <0.03)

## 2016-04-28 MED ORDER — SODIUM CHLORIDE 0.9 % IV SOLN: 250.0000 mL | INTRAVENOUS | Status: DC | PRN

## 2016-04-28 MED ORDER — METOPROLOL SUCCINATE ER 50 MG PO TB24
50.0000 mg | ORAL_TABLET | Freq: Every day | ORAL | Status: DC
  Administered 2016-04-29: 50 mg via ORAL
  Filled 2016-04-28: qty 1

## 2016-04-28 MED ORDER — SODIUM CHLORIDE 0.9% FLUSH
3.0000 mL | Freq: Two times a day (BID) | INTRAVENOUS | Status: DC
  Administered 2016-04-28 – 2016-04-30 (×4): 3 mL via INTRAVENOUS

## 2016-04-28 MED ORDER — LATANOPROST 0.005 % OP SOLN
1.0000 [drp] | Freq: Every day | OPHTHALMIC | Status: DC
  Administered 2016-04-28 – 2016-04-29 (×2): 1 [drp] via OPHTHALMIC
  Filled 2016-04-28: qty 2.5

## 2016-04-28 MED ORDER — SODIUM CHLORIDE 0.9% FLUSH: 3.0000 mL | INTRAVENOUS | Status: DC | PRN

## 2016-04-28 MED ORDER — ASPIRIN EC 81 MG PO TBEC
81.0000 mg | DELAYED_RELEASE_TABLET | Freq: Every day | ORAL | Status: DC
  Administered 2016-04-29 – 2016-04-30 (×2): 81 mg via ORAL
  Filled 2016-04-28 (×2): qty 1

## 2016-04-28 MED ORDER — SODIUM CHLORIDE 0.9% FLUSH
3.0000 mL | Freq: Two times a day (BID) | INTRAVENOUS | Status: DC
  Administered 2016-04-28 – 2016-04-30 (×3): 3 mL via INTRAVENOUS

## 2016-04-28 MED ORDER — METOPROLOL TARTRATE 50 MG PO TABS
50.0000 mg | ORAL_TABLET | Freq: Every evening | ORAL | Status: DC
  Administered 2016-04-28: 50 mg via ORAL
  Filled 2016-04-28: qty 1

## 2016-04-28 NOTE — ED Notes (Signed)
Admitting MD at bedside.
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
## 2016-04-29 ENCOUNTER — Encounter (HOSPITAL_COMMUNITY): Payer: Self-pay

## 2016-04-29 DIAGNOSIS — N185 Chronic kidney disease, stage 5: Secondary | ICD-10-CM | POA: Diagnosis present

## 2016-04-29 DIAGNOSIS — F488 Other specified nonpsychotic mental disorders: Secondary | ICD-10-CM

## 2016-04-29 DIAGNOSIS — S0003XA Contusion of scalp, initial encounter: Secondary | ICD-10-CM | POA: Diagnosis present

## 2016-04-29 DIAGNOSIS — Z880 Allergy status to penicillin: Secondary | ICD-10-CM | POA: Diagnosis not present

## 2016-04-29 DIAGNOSIS — H3530 Unspecified macular degeneration: Secondary | ICD-10-CM | POA: Diagnosis present

## 2016-04-29 DIAGNOSIS — Y92410 Unspecified street and highway as the place of occurrence of the external cause: Secondary | ICD-10-CM | POA: Diagnosis not present

## 2016-04-29 DIAGNOSIS — D649 Anemia, unspecified: Secondary | ICD-10-CM | POA: Diagnosis present

## 2016-04-29 DIAGNOSIS — I350 Nonrheumatic aortic (valve) stenosis: Secondary | ICD-10-CM | POA: Diagnosis not present

## 2016-04-29 DIAGNOSIS — R55 Syncope and collapse: Secondary | ICD-10-CM | POA: Diagnosis present

## 2016-04-29 DIAGNOSIS — Z79899 Other long term (current) drug therapy: Secondary | ICD-10-CM | POA: Diagnosis not present

## 2016-04-29 DIAGNOSIS — Z515 Encounter for palliative care: Secondary | ICD-10-CM

## 2016-04-29 DIAGNOSIS — E785 Hyperlipidemia, unspecified: Secondary | ICD-10-CM | POA: Diagnosis present

## 2016-04-29 DIAGNOSIS — Z7189 Other specified counseling: Secondary | ICD-10-CM | POA: Diagnosis not present

## 2016-04-29 DIAGNOSIS — I5042 Chronic combined systolic (congestive) and diastolic (congestive) heart failure: Secondary | ICD-10-CM | POA: Diagnosis present

## 2016-04-29 DIAGNOSIS — I132 Hypertensive heart and chronic kidney disease with heart failure and with stage 5 chronic kidney disease, or end stage renal disease: Secondary | ICD-10-CM | POA: Diagnosis present

## 2016-04-29 DIAGNOSIS — Z951 Presence of aortocoronary bypass graft: Secondary | ICD-10-CM | POA: Diagnosis not present

## 2016-04-29 DIAGNOSIS — Z7982 Long term (current) use of aspirin: Secondary | ICD-10-CM | POA: Diagnosis not present

## 2016-04-29 DIAGNOSIS — I25810 Atherosclerosis of coronary artery bypass graft(s) without angina pectoris: Secondary | ICD-10-CM | POA: Diagnosis present

## 2016-04-29 DIAGNOSIS — E875 Hyperkalemia: Secondary | ICD-10-CM | POA: Diagnosis present

## 2016-04-29 LAB — BASIC METABOLIC PANEL
Anion gap: 11 (ref 5–15)
BUN: 56 mg/dL — AB (ref 6–20)
CHLORIDE: 101 mmol/L (ref 101–111)
CO2: 27 mmol/L (ref 22–32)
CREATININE: 3.29 mg/dL — AB (ref 0.61–1.24)
Calcium: 9.5 mg/dL (ref 8.9–10.3)
GFR calc Af Amer: 17 mL/min — ABNORMAL LOW (ref >60)
GFR calc non Af Amer: 15 mL/min — ABNORMAL LOW (ref >60)
Glucose, Bld: 109 mg/dL — ABNORMAL HIGH (ref 65–99)
POTASSIUM: 5 mmol/L (ref 3.5–5.1)
SODIUM: 139 mmol/L (ref 135–145)

## 2016-04-29 LAB — CBC
HEMATOCRIT: 30.8 % — AB (ref 39.0–52.0)
Hemoglobin: 9.9 g/dL — ABNORMAL LOW (ref 13.0–17.0)
MCH: 33.7 pg (ref 26.0–34.0)
MCHC: 32.1 g/dL (ref 30.0–36.0)
MCV: 104.8 fL — AB (ref 78.0–100.0)
PLATELETS: 152 10*3/uL (ref 150–400)
RBC: 2.94 MIL/uL — AB (ref 4.22–5.81)
RDW: 15.6 % — AB (ref 11.5–15.5)
WBC: 7.2 10*3/uL (ref 4.0–10.5)

## 2016-04-29 LAB — MRSA PCR SCREENING: MRSA by PCR: NEGATIVE

## 2016-04-29 LAB — TROPONIN I
Troponin I: 0.18 ng/mL (ref <0.03)
Troponin I: 0.18 ng/mL (ref <0.03)

## 2016-04-29 MED ORDER — SODIUM POLYSTYRENE SULFONATE 15 GM/60ML PO SUSP
15.0000 g | Freq: Once | ORAL | Status: AC
  Administered 2016-04-29: 15 g via ORAL
  Filled 2016-04-29: qty 60

## 2016-04-29 MED ORDER — FUROSEMIDE 10 MG/ML IJ SOLN
20.0000 mg | Freq: Every day | INTRAMUSCULAR | Status: DC
  Administered 2016-04-29: 20 mg via INTRAVENOUS
  Filled 2016-04-29: qty 2

## 2016-04-29 NOTE — Progress Notes (Signed)
Subjective:  Still with mild shortness of breath, no recurrent syncope  Objective:  Vital Signs in the last 24 hours: BP (!) 98/52 (BP Location: Left Arm)   Pulse 91   Temp 97.8 F (36.6 C) (Oral)   Resp 18   Ht 5\' 6"  (1.676 m)   Wt 67.4 kg (148 lb 8 oz)   SpO2 94%   BMI 23.97 kg/m   Physical Exam: Very alert elderly male conversant in no acute distress Lungs:  Clear  Cardiac:  Regular rhythm, normal S1 and S2, no S3, harsh 2 to 3/6 systolic murmur Extremities:  No edema present  Intake/Output from previous day: 03/02 0701 - 03/03 0700 In: 240 [P.O.:240] Out: 200 [Urine:200] Weight Filed Weights   04/28/16 2143 04/29/16 0629  Weight: 67.7 kg (149 lb 3.2 oz) 67.4 kg (148 lb 8 oz)    Lab Results: Basic Metabolic Panel:  Recent Labs  04/28/16 1744 04/29/16 0424  NA 138 139  K 4.4 5.0  CL 102 101  CO2 27 27  GLUCOSE 128* 109*  BUN 53* 56*  CREATININE 3.02* 3.29*    CBC:  Recent Labs  04/28/16 1523 04/28/16 1544 04/29/16 0424  WBC 7.2  --  7.2  NEUTROABS 5.7  --   --   HGB 10.9* 11.6* 9.9*  HCT 33.0* 34.0* 30.8*  MCV 104.1*  --  104.8*  PLT 198  --  152    BNP    Component Value Date/Time   BNP 4,102.6 (H) 01/21/2016 1727   BNP 2,672.4 (H) 01/07/2016 1602   Cardiac Panel (last 3 results)  Recent Labs  04/28/16 2154 04/29/16 0424 04/29/16 0932  TROPONINI 0.15* 0.18* 0.18*   Assessment/Plan:  1.  Critical aortic stenosis not felt to be candidate for TAVR with recent syncopal episode 2.  Worsening dyspnea likely due to critical aortic stenosis  Recommendations:  Palliative care is currently seeing.  I would increase his furosemide orally as needed to control symptoms.  This can be done at the expense of worsening renal function if needed.  Discussed driving recommendations.  Issues mainly are to be able to improve quality of his life remaining.      Shawn Hough  MD [facility] Cardiology  04/29/2016, 1:31 PM
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
## 2016-04-29 NOTE — Progress Notes (Signed)
Patient with no complaints on 7 a to 7 pm shift, vital signs stable.  Patient does have soft BP's which he states is his baseline, 90s/50's throughout shift.  Patient walked to bathroom with assistance from walker, denied any dizziness or chest pain.  Was short of breath with exertion which he states is his baseline. O2 sats in 90's on room air.    Patient sat up in chair for the majority of shift, family members in to visit and meet with palliative.
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
## 2016-04-30 LAB — BASIC METABOLIC PANEL
Anion gap: 11 (ref 5–15)
BUN: 58 mg/dL — AB (ref 6–20)
CHLORIDE: 103 mmol/L (ref 101–111)
CO2: 25 mmol/L (ref 22–32)
CREATININE: 3.3 mg/dL — AB (ref 0.61–1.24)
Calcium: 8.8 mg/dL — ABNORMAL LOW (ref 8.9–10.3)
GFR calc Af Amer: 17 mL/min — ABNORMAL LOW (ref >60)
GFR calc non Af Amer: 15 mL/min — ABNORMAL LOW (ref >60)
Glucose, Bld: 93 mg/dL (ref 65–99)
Potassium: 3.9 mmol/L (ref 3.5–5.1)
SODIUM: 139 mmol/L (ref 135–145)

## 2016-04-30 MED ORDER — FUROSEMIDE 10 MG/ML IJ SOLN
20.0000 mg | Freq: Once | INTRAMUSCULAR | Status: AC
  Administered 2016-04-30: 20 mg via INTRAVENOUS
  Filled 2016-04-30: qty 2

## 2016-04-30 NOTE — Progress Notes (Signed)
Orders received for pt discharge.  Discharge summary printed and reviewed with pt.  Explained medication regimen, and pt had no further questions at this time.  IV removed and site remains clean, dry, intact.  Telemetry removed.  Pt in stable condition and awaiting transport. 

## 2016-04-30 NOTE — Discharge Instructions (Signed)
Follow with Primary MD [name], MD and Dr Linard Millers in 7 days   Get CBC, CMP, 2 view Chest X ray checked  by Primary MD or SNF MD in 5-7 days ( we routinely change or add medications that can affect your baseline labs and fluid status, therefore we recommend that you get the mentioned basic workup next visit with your PCP, your PCP may decide not to get them or add new tests based on their clinical decision)  Activity: As tolerated with Full fall precautions use walker/cane & assistance as needed  Disposition Home    Diet: Heart Healthy    For Heart failure patients - Check your Weight same time everyday, if you gain over 2 pounds, or you develop in leg swelling, experience more shortness of breath or chest pain, call your Primary MD immediately. Follow Cardiac Low Salt Diet and 1.5 lit/day fluid restriction.  On your next visit with your primary care physician please Get Medicines reviewed and adjusted.  Please request your Prim.MD to go over all Hospital Tests and Procedure/Radiological results at the follow up, please get all Hospital records sent to your Prim MD by signing hospital release before you go home.  If you experience worsening of your admission symptoms, develop shortness of breath, life threatening emergency, suicidal or homicidal thoughts you must seek medical attention immediately by calling 911 or calling your MD immediately  if symptoms less severe.  You Must read complete instructions/literature along with all the possible adverse reactions/side effects for all the Medicines you take and that have been prescribed to you. Take any new Medicines after you have completely understood and accpet all the possible adverse reactions/side effects.   Do not drive, operate heavy machinery, perform activities at heights, swimming or participation in water activities or provide baby sitting services if your were admitted for syncope or siezures until you have seen by Primary MD or  a Neurologist and advised to do so again.  Do not drive when taking Pain medications.    Do not take more than prescribed Pain, Sleep and Anxiety Medications  Special Instructions: If you have smoked or chewed Tobacco  in the last 2 yrs please stop smoking, stop any regular Alcohol  and or any Recreational drug use.  Wear Seat belts while driving.   Please note  You were cared for by a hospitalist during your hospital stay. If you have any questions about your discharge medications or the care you received while you were in the hospital after you are discharged, you can call the unit and asked to speak with the hospitalist on call if the hospitalist that took care of you is not available. Once you are discharged, your primary care physician will handle any further medical issues. Please note that NO REFILLS for any discharge medications will be authorized once you are discharged, as it is imperative that you return to your primary care physician (or establish a relationship with a primary care physician if you do not have one) for your aftercare needs so that they can reassess your need for medications and monitor your lab values.
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
## 2016-05-01 ENCOUNTER — Ambulatory Visit (INDEPENDENT_AMBULATORY_CARE_PROVIDER_SITE_OTHER): Payer: Medicare Other | Admitting: Cardiology

## 2016-05-01 ENCOUNTER — Encounter: Payer: Self-pay | Admitting: Cardiology

## 2016-05-01 ENCOUNTER — Telehealth: Payer: Self-pay | Admitting: Interventional Cardiology

## 2016-05-01 VITALS — BP 110/68 | HR 84 | Ht 66.0 in | Wt 151.0 lb

## 2016-05-01 DIAGNOSIS — E782 Mixed hyperlipidemia: Secondary | ICD-10-CM

## 2016-05-01 DIAGNOSIS — I350 Nonrheumatic aortic (valve) stenosis: Secondary | ICD-10-CM

## 2016-05-01 DIAGNOSIS — R55 Syncope and collapse: Secondary | ICD-10-CM | POA: Diagnosis not present

## 2016-05-01 DIAGNOSIS — R0602 Shortness of breath: Secondary | ICD-10-CM | POA: Diagnosis not present

## 2016-05-01 DIAGNOSIS — N184 Chronic kidney disease, stage 4 (severe): Secondary | ICD-10-CM | POA: Diagnosis not present

## 2016-05-01 DIAGNOSIS — I25810 Atherosclerosis of coronary artery bypass graft(s) without angina pectoris: Secondary | ICD-10-CM | POA: Diagnosis not present

## 2016-05-01 NOTE — Telephone Encounter (Signed)
New message   Pt daughter-n-law verbalized that pt was seen in the hospital and was told to make fu   Pt wanted same day appt with PA she is scheduled today with Dorene Ar at 2:00pm

## 2016-05-01 NOTE — Patient Instructions (Signed)
Medication Instructions:  You may take an extra Lasix 20 mg weekly for increased shortness of breath.  Labwork: None  Testing/Procedures: None  Follow-Up: Dr. Thompson Caul nurse will call you to schedule an appointment.   Any Other Special Instructions Will Be Listed Below (If Applicable).     If you need a refill on your cardiac medications before your next appointment, please call your pharmacy.
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
## 2016-05-02 NOTE — Progress Notes (Signed)
Yes, he had syncope related to AS. We have no definitive therapy and therefore he will not be able to drive again because as the AS worsens, recurrent syncope is likely.  Yes, 40 mg lasix daily.

## 2016-05-03 ENCOUNTER — Other Ambulatory Visit: Payer: Self-pay | Admitting: *Deleted

## 2016-05-03 MED ORDER — FUROSEMIDE 20 MG PO TABS: 40.0000 mg | ORAL_TABLET | Freq: Every day | ORAL | 0 refills | Status: DC

## 2016-05-05 DIAGNOSIS — R55 Syncope and collapse: Secondary | ICD-10-CM | POA: Diagnosis not present

## 2016-05-05 DIAGNOSIS — I779 Disorder of arteries and arterioles, unspecified: Secondary | ICD-10-CM | POA: Diagnosis not present

## 2016-05-05 DIAGNOSIS — I5042 Chronic combined systolic (congestive) and diastolic (congestive) heart failure: Secondary | ICD-10-CM | POA: Diagnosis not present

## 2016-05-05 DIAGNOSIS — I10 Essential (primary) hypertension: Secondary | ICD-10-CM | POA: Diagnosis not present

## 2016-05-05 DIAGNOSIS — I350 Nonrheumatic aortic (valve) stenosis: Secondary | ICD-10-CM | POA: Diagnosis not present

## 2016-05-05 DIAGNOSIS — E7800 Pure hypercholesterolemia, unspecified: Secondary | ICD-10-CM | POA: Diagnosis not present

## 2016-05-05 DIAGNOSIS — Z0000 Encounter for general adult medical examination without abnormal findings: Secondary | ICD-10-CM | POA: Diagnosis not present

## 2016-05-05 DIAGNOSIS — I2510 Atherosclerotic heart disease of native coronary artery without angina pectoris: Secondary | ICD-10-CM | POA: Diagnosis not present

## 2016-05-09 ENCOUNTER — Other Ambulatory Visit: Payer: Self-pay | Admitting: *Deleted

## 2016-05-09 ENCOUNTER — Encounter: Payer: Self-pay | Admitting: Cardiology

## 2016-05-09 MED ORDER — FUROSEMIDE 20 MG PO TABS: 40.0000 mg | ORAL_TABLET | Freq: Every day | ORAL | 3 refills | Status: DC

## 2016-05-10 ENCOUNTER — Telehealth: Payer: Self-pay | Admitting: Interventional Cardiology

## 2016-05-10 NOTE — Telephone Encounter (Signed)
New message    Pt is calling about a letter for his lawyer that he no longer wants his license.

## 2016-05-10 NOTE — Telephone Encounter (Signed)
Left message to call back  

## 2016-05-11 ENCOUNTER — Other Ambulatory Visit: Payer: Self-pay | Admitting: *Deleted

## 2016-05-11 MED ORDER — FUROSEMIDE 20 MG PO TABS: 40.0000 mg | ORAL_TABLET | Freq: Every day | ORAL | 3 refills | Status: DC

## 2016-05-11 NOTE — Telephone Encounter (Signed)
Spoke with pt and let him know that letter was ready.  He asked that I mail this to him.  Verified mailing address and advised I would place that in mail today.  Pt appreciative for call.

## 2016-05-18 ENCOUNTER — Ambulatory Visit (INDEPENDENT_AMBULATORY_CARE_PROVIDER_SITE_OTHER): Payer: Medicare Other | Admitting: Interventional Cardiology

## 2016-05-18 ENCOUNTER — Encounter: Payer: Self-pay | Admitting: Interventional Cardiology

## 2016-05-18 VITALS — BP 90/58 | HR 77 | Ht 66.0 in | Wt 147.4 lb

## 2016-05-18 DIAGNOSIS — N185 Chronic kidney disease, stage 5: Secondary | ICD-10-CM | POA: Diagnosis not present

## 2016-05-18 DIAGNOSIS — I25810 Atherosclerosis of coronary artery bypass graft(s) without angina pectoris: Secondary | ICD-10-CM

## 2016-05-18 DIAGNOSIS — I350 Nonrheumatic aortic (valve) stenosis: Secondary | ICD-10-CM | POA: Diagnosis not present

## 2016-05-18 DIAGNOSIS — I739 Peripheral vascular disease, unspecified: Secondary | ICD-10-CM

## 2016-05-18 DIAGNOSIS — I10 Essential (primary) hypertension: Secondary | ICD-10-CM

## 2016-05-18 DIAGNOSIS — I209 Angina pectoris, unspecified: Secondary | ICD-10-CM | POA: Diagnosis not present

## 2016-05-18 DIAGNOSIS — I25701 Atherosclerosis of coronary artery bypass graft(s), unspecified, with angina pectoris with documented spasm: Secondary | ICD-10-CM | POA: Diagnosis not present

## 2016-05-18 DIAGNOSIS — I779 Disorder of arteries and arterioles, unspecified: Secondary | ICD-10-CM

## 2016-05-18 DIAGNOSIS — I5042 Chronic combined systolic (congestive) and diastolic (congestive) heart failure: Secondary | ICD-10-CM

## 2016-05-18 MED ORDER — FUROSEMIDE 40 MG PO TABS: 40.0000 mg | ORAL_TABLET | Freq: Every day | ORAL | 3 refills | Status: DC

## 2016-05-18 NOTE — Patient Instructions (Signed)
Medication Instructions:  None  Labwork: None  Testing/Procedures: None  Follow-Up: Your physician recommends that you schedule a follow-up appointment in: 6-8 weeks with Dr. [name].    Any Other Special Instructions Will Be Listed Below (If Applicable).  Prior to your follow up appointment, please speak with your family about DNR (Do Not Resuscitate).    If you need a refill on your cardiac medications before your next appointment, please call your pharmacy.
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
## 2016-06-06 DIAGNOSIS — R938 Abnormal findings on diagnostic imaging of other specified body structures: Secondary | ICD-10-CM | POA: Diagnosis not present

## 2016-06-08 ENCOUNTER — Other Ambulatory Visit (HOSPITAL_BASED_OUTPATIENT_CLINIC_OR_DEPARTMENT_OTHER): Payer: Self-pay | Admitting: Family Medicine

## 2016-06-08 DIAGNOSIS — N184 Chronic kidney disease, stage 4 (severe): Secondary | ICD-10-CM | POA: Diagnosis not present

## 2016-06-08 DIAGNOSIS — D631 Anemia in chronic kidney disease: Secondary | ICD-10-CM | POA: Diagnosis not present

## 2016-06-08 DIAGNOSIS — M1990 Unspecified osteoarthritis, unspecified site: Secondary | ICD-10-CM | POA: Diagnosis not present

## 2016-06-08 DIAGNOSIS — R630 Anorexia: Secondary | ICD-10-CM | POA: Diagnosis not present

## 2016-06-08 DIAGNOSIS — I350 Nonrheumatic aortic (valve) stenosis: Secondary | ICD-10-CM | POA: Diagnosis not present

## 2016-06-08 DIAGNOSIS — H3530 Unspecified macular degeneration: Secondary | ICD-10-CM | POA: Diagnosis not present

## 2016-06-08 DIAGNOSIS — N185 Chronic kidney disease, stage 5: Secondary | ICD-10-CM | POA: Diagnosis not present

## 2016-06-08 DIAGNOSIS — I10 Essential (primary) hypertension: Secondary | ICD-10-CM | POA: Diagnosis not present

## 2016-06-08 DIAGNOSIS — R0600 Dyspnea, unspecified: Secondary | ICD-10-CM | POA: Diagnosis not present

## 2016-06-08 DIAGNOSIS — I25119 Atherosclerotic heart disease of native coronary artery with unspecified angina pectoris: Secondary | ICD-10-CM | POA: Diagnosis not present

## 2016-06-08 DIAGNOSIS — E785 Hyperlipidemia, unspecified: Secondary | ICD-10-CM | POA: Diagnosis not present

## 2016-06-08 DIAGNOSIS — I5040 Unspecified combined systolic (congestive) and diastolic (congestive) heart failure: Secondary | ICD-10-CM | POA: Diagnosis not present

## 2016-06-08 DIAGNOSIS — R634 Abnormal weight loss: Secondary | ICD-10-CM | POA: Diagnosis not present

## 2016-06-08 DIAGNOSIS — J90 Pleural effusion, not elsewhere classified: Secondary | ICD-10-CM

## 2016-06-08 DIAGNOSIS — I6523 Occlusion and stenosis of bilateral carotid arteries: Secondary | ICD-10-CM | POA: Diagnosis not present

## 2016-06-09 DIAGNOSIS — E785 Hyperlipidemia, unspecified: Secondary | ICD-10-CM | POA: Diagnosis not present

## 2016-06-09 DIAGNOSIS — N185 Chronic kidney disease, stage 5: Secondary | ICD-10-CM | POA: Diagnosis not present

## 2016-06-09 DIAGNOSIS — I350 Nonrheumatic aortic (valve) stenosis: Secondary | ICD-10-CM | POA: Diagnosis not present

## 2016-06-09 DIAGNOSIS — I5040 Unspecified combined systolic (congestive) and diastolic (congestive) heart failure: Secondary | ICD-10-CM | POA: Diagnosis not present

## 2016-06-09 DIAGNOSIS — I25119 Atherosclerotic heart disease of native coronary artery with unspecified angina pectoris: Secondary | ICD-10-CM | POA: Diagnosis not present

## 2016-06-09 DIAGNOSIS — I10 Essential (primary) hypertension: Secondary | ICD-10-CM | POA: Diagnosis not present

## 2016-06-12 ENCOUNTER — Telehealth: Payer: Self-pay | Admitting: Interventional Cardiology

## 2016-06-12 DIAGNOSIS — N185 Chronic kidney disease, stage 5: Secondary | ICD-10-CM | POA: Diagnosis not present

## 2016-06-12 DIAGNOSIS — E785 Hyperlipidemia, unspecified: Secondary | ICD-10-CM | POA: Diagnosis not present

## 2016-06-12 DIAGNOSIS — I10 Essential (primary) hypertension: Secondary | ICD-10-CM | POA: Diagnosis not present

## 2016-06-12 DIAGNOSIS — I350 Nonrheumatic aortic (valve) stenosis: Secondary | ICD-10-CM | POA: Diagnosis not present

## 2016-06-12 DIAGNOSIS — I5040 Unspecified combined systolic (congestive) and diastolic (congestive) heart failure: Secondary | ICD-10-CM | POA: Diagnosis not present

## 2016-06-12 DIAGNOSIS — I25119 Atherosclerotic heart disease of native coronary artery with unspecified angina pectoris: Secondary | ICD-10-CM | POA: Diagnosis not present

## 2016-06-12 NOTE — Telephone Encounter (Signed)
New Message:   Shawn Long says he wants Dr [name] to know that he is a Hosp[facility] Shawn Long now,

## 2016-06-12 NOTE — Telephone Encounter (Signed)
Made Dr. [name] aware.  Spoke with pt and advised him that I have made Dr. [name] aware and to call us if needed.  Pt appreciative for call.

## 2016-06-14 ENCOUNTER — Ambulatory Visit (HOSPITAL_BASED_OUTPATIENT_CLINIC_OR_DEPARTMENT_OTHER)
Admission: RE | Admit: 2016-06-14 | Discharge: 2016-06-14 | Disposition: A | Discharge status: Home / Self Care | Payer: Medicare Other | Source: Ambulatory Visit | Attending: Family Medicine | Admitting: Family Medicine

## 2016-06-14 DIAGNOSIS — Z951 Presence of aortocoronary bypass graft: Secondary | ICD-10-CM | POA: Diagnosis not present

## 2016-06-14 DIAGNOSIS — N185 Chronic kidney disease, stage 5: Secondary | ICD-10-CM | POA: Diagnosis not present

## 2016-06-14 DIAGNOSIS — I25119 Atherosclerotic heart disease of native coronary artery with unspecified angina pectoris: Secondary | ICD-10-CM | POA: Diagnosis not present

## 2016-06-14 DIAGNOSIS — I350 Nonrheumatic aortic (valve) stenosis: Secondary | ICD-10-CM | POA: Diagnosis not present

## 2016-06-14 DIAGNOSIS — I700 Atherosclerosis of aorta: Secondary | ICD-10-CM | POA: Insufficient documentation

## 2016-06-14 DIAGNOSIS — J9811 Atelectasis: Secondary | ICD-10-CM | POA: Diagnosis not present

## 2016-06-14 DIAGNOSIS — J90 Pleural effusion, not elsewhere classified: Secondary | ICD-10-CM | POA: Insufficient documentation

## 2016-06-14 DIAGNOSIS — I5040 Unspecified combined systolic (congestive) and diastolic (congestive) heart failure: Secondary | ICD-10-CM | POA: Diagnosis not present

## 2016-06-14 DIAGNOSIS — I10 Essential (primary) hypertension: Secondary | ICD-10-CM | POA: Diagnosis not present

## 2016-06-14 DIAGNOSIS — I2510 Atherosclerotic heart disease of native coronary artery without angina pectoris: Secondary | ICD-10-CM | POA: Insufficient documentation

## 2016-06-14 DIAGNOSIS — E785 Hyperlipidemia, unspecified: Secondary | ICD-10-CM | POA: Diagnosis not present

## 2016-06-15 ENCOUNTER — Other Ambulatory Visit (HOSPITAL_BASED_OUTPATIENT_CLINIC_OR_DEPARTMENT_OTHER): Payer: Medicare Other

## 2016-06-15 ENCOUNTER — Ambulatory Visit (HOSPITAL_BASED_OUTPATIENT_CLINIC_OR_DEPARTMENT_OTHER): Payer: Medicare Other

## 2016-06-19 DIAGNOSIS — I10 Essential (primary) hypertension: Secondary | ICD-10-CM | POA: Diagnosis not present

## 2016-06-19 DIAGNOSIS — N185 Chronic kidney disease, stage 5: Secondary | ICD-10-CM | POA: Diagnosis not present

## 2016-06-19 DIAGNOSIS — I25119 Atherosclerotic heart disease of native coronary artery with unspecified angina pectoris: Secondary | ICD-10-CM | POA: Diagnosis not present

## 2016-06-19 DIAGNOSIS — E785 Hyperlipidemia, unspecified: Secondary | ICD-10-CM | POA: Diagnosis not present

## 2016-06-19 DIAGNOSIS — I5040 Unspecified combined systolic (congestive) and diastolic (congestive) heart failure: Secondary | ICD-10-CM | POA: Diagnosis not present

## 2016-06-19 DIAGNOSIS — I350 Nonrheumatic aortic (valve) stenosis: Secondary | ICD-10-CM | POA: Diagnosis not present

## 2016-06-22 ENCOUNTER — Telehealth: Payer: Self-pay | Admitting: Interventional Cardiology

## 2016-06-22 DIAGNOSIS — I350 Nonrheumatic aortic (valve) stenosis: Secondary | ICD-10-CM | POA: Diagnosis not present

## 2016-06-22 DIAGNOSIS — I10 Essential (primary) hypertension: Secondary | ICD-10-CM | POA: Diagnosis not present

## 2016-06-22 DIAGNOSIS — I5040 Unspecified combined systolic (congestive) and diastolic (congestive) heart failure: Secondary | ICD-10-CM | POA: Diagnosis not present

## 2016-06-22 DIAGNOSIS — E785 Hyperlipidemia, unspecified: Secondary | ICD-10-CM | POA: Diagnosis not present

## 2016-06-22 DIAGNOSIS — N185 Chronic kidney disease, stage 5: Secondary | ICD-10-CM | POA: Diagnosis not present

## 2016-06-22 DIAGNOSIS — I25119 Atherosclerotic heart disease of native coronary artery with unspecified angina pectoris: Secondary | ICD-10-CM | POA: Diagnosis not present

## 2016-06-22 NOTE — Telephone Encounter (Signed)
Left message with triage nurse to call back

## 2016-06-22 NOTE — Telephone Encounter (Signed)
Amy from Hospice returned call.  She states that she had spoke with Shawn Long and he made her aware that he recently had a CT scan and Lasix was increased to 2tabs (80mg ) in the morning and 1 tab in the evening, based off of these results.  She states Shawn Long asked her to contact our office to make [place] aware.  She states that she has been seeing Shawn Long for 2 weeks and he has no change in condition.  Shawn Long becomes winded with minimal exertion.  Shawn Long has only been on increased dose of Lasix for 2 days.  I advised her that I would update med list and call Shawn Long to check in with him.  Shawn Long has f/u appt with Dr. [name] next week as well.    Called Shawn Long and left message to call back.

## 2016-06-22 NOTE — Telephone Encounter (Signed)
Amy from Hospice would like to speak to you concerning the pt is having to change is Lasix to 2 in morning and 1 at night. Please call Amy

## 2016-06-27 DIAGNOSIS — I5040 Unspecified combined systolic (congestive) and diastolic (congestive) heart failure: Secondary | ICD-10-CM | POA: Diagnosis not present

## 2016-06-27 DIAGNOSIS — N185 Chronic kidney disease, stage 5: Secondary | ICD-10-CM | POA: Diagnosis not present

## 2016-06-27 DIAGNOSIS — M1990 Unspecified osteoarthritis, unspecified site: Secondary | ICD-10-CM | POA: Diagnosis not present

## 2016-06-27 DIAGNOSIS — I10 Essential (primary) hypertension: Secondary | ICD-10-CM | POA: Diagnosis not present

## 2016-06-27 DIAGNOSIS — R630 Anorexia: Secondary | ICD-10-CM | POA: Diagnosis not present

## 2016-06-27 DIAGNOSIS — D631 Anemia in chronic kidney disease: Secondary | ICD-10-CM | POA: Diagnosis not present

## 2016-06-27 DIAGNOSIS — I25119 Atherosclerotic heart disease of native coronary artery with unspecified angina pectoris: Secondary | ICD-10-CM | POA: Diagnosis not present

## 2016-06-27 DIAGNOSIS — I6523 Occlusion and stenosis of bilateral carotid arteries: Secondary | ICD-10-CM | POA: Diagnosis not present

## 2016-06-27 DIAGNOSIS — R0600 Dyspnea, unspecified: Secondary | ICD-10-CM | POA: Diagnosis not present

## 2016-06-27 DIAGNOSIS — I350 Nonrheumatic aortic (valve) stenosis: Secondary | ICD-10-CM | POA: Diagnosis not present

## 2016-06-27 DIAGNOSIS — R634 Abnormal weight loss: Secondary | ICD-10-CM | POA: Diagnosis not present

## 2016-06-27 DIAGNOSIS — H3530 Unspecified macular degeneration: Secondary | ICD-10-CM | POA: Diagnosis not present

## 2016-06-27 DIAGNOSIS — E785 Hyperlipidemia, unspecified: Secondary | ICD-10-CM | POA: Diagnosis not present
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
## 2016-06-29 ENCOUNTER — Ambulatory Visit (INDEPENDENT_AMBULATORY_CARE_PROVIDER_SITE_OTHER): Admitting: Interventional Cardiology

## 2016-06-29 ENCOUNTER — Encounter: Payer: Self-pay | Admitting: Interventional Cardiology

## 2016-06-29 ENCOUNTER — Telehealth: Payer: Self-pay | Admitting: Interventional Cardiology

## 2016-06-29 VITALS — BP 96/60 | HR 81 | Ht 66.0 in | Wt 136.2 lb

## 2016-06-29 DIAGNOSIS — N185 Chronic kidney disease, stage 5: Secondary | ICD-10-CM | POA: Diagnosis not present

## 2016-06-29 DIAGNOSIS — I10 Essential (primary) hypertension: Secondary | ICD-10-CM | POA: Diagnosis not present

## 2016-06-29 DIAGNOSIS — I25701 Atherosclerosis of coronary artery bypass graft(s), unspecified, with angina pectoris with documented spasm: Secondary | ICD-10-CM | POA: Diagnosis not present

## 2016-06-29 DIAGNOSIS — I350 Nonrheumatic aortic (valve) stenosis: Secondary | ICD-10-CM | POA: Diagnosis not present

## 2016-06-29 DIAGNOSIS — I5043 Acute on chronic combined systolic (congestive) and diastolic (congestive) heart failure: Secondary | ICD-10-CM | POA: Diagnosis not present

## 2016-06-29 DIAGNOSIS — I779 Disorder of arteries and arterioles, unspecified: Secondary | ICD-10-CM

## 2016-06-29 DIAGNOSIS — I25119 Atherosclerotic heart disease of native coronary artery with unspecified angina pectoris: Secondary | ICD-10-CM | POA: Diagnosis not present

## 2016-06-29 DIAGNOSIS — I5040 Unspecified combined systolic (congestive) and diastolic (congestive) heart failure: Secondary | ICD-10-CM | POA: Diagnosis not present

## 2016-06-29 DIAGNOSIS — I739 Peripheral vascular disease, unspecified: Secondary | ICD-10-CM

## 2016-06-29 DIAGNOSIS — E785 Hyperlipidemia, unspecified: Secondary | ICD-10-CM | POA: Diagnosis not present

## 2016-06-29 MED ORDER — FUROSEMIDE 40 MG PO TABS: 80.0000 mg | ORAL_TABLET | Freq: Every day | ORAL | 1 refills | Status: DC

## 2016-06-29 NOTE — Telephone Encounter (Signed)
Pt seen in office today.

## 2016-06-29 NOTE — Telephone Encounter (Signed)
New Message:    Please call,concerning [name].

## 2016-06-29 NOTE — Patient Instructions (Signed)
Medication Instructions:  1) TAKE Furosemide 80mg  (2 tabs) once daily.  Labwork: None  Testing/Procedures: None  Follow-Up: Your physician recommends that you schedule a follow-up appointment as needed with Dr. [name].    Any Other Special Instructions Will Be Listed Below (If Applicable).     If you need a refill on your cardiac medications before your next appointment, please call your pharmacy.

## 2016-06-29 NOTE — Telephone Encounter (Signed)
Spoke with Hospice nurse, Amy and she states that she was there to see pt at this time and they went over our changes from the visit today.  Amy spoke with pt about recent Chest CT and recommendations from Dr. Carmina Miller to increase fluid meds and if that didn't work to help resolve pleural effusions, possibly doing a thoracentesis.  Pt wanted Amy to contact us to get Dr. Thompson Caul input on whether a thoracentesis would be beneficial.  Advised I would speak with Dr. [name] and call back.  Spoke with Dr. [name] and he feels that a thoracentesis could be beneficial but this would be short term and also that there is a fair amount of pain with this.    Spoke with Hospice nurse, Amy and made her aware of information provided by Dr. [name].  Amy appreciative for call.  Spoke with pt and made him aware also.  Pt appreciative for assistance and will speak with Dr. Marcelyn Ditty office as well.

## 2016-07-04 DIAGNOSIS — H401122 Primary open-angle glaucoma, left eye, moderate stage: Secondary | ICD-10-CM | POA: Diagnosis not present

## 2016-07-04 DIAGNOSIS — H35359 Cystoid macular degeneration, unspecified eye: Secondary | ICD-10-CM | POA: Diagnosis not present

## 2016-07-04 DIAGNOSIS — H401111 Primary open-angle glaucoma, right eye, mild stage: Secondary | ICD-10-CM | POA: Diagnosis not present

## 2016-07-04 DIAGNOSIS — H353232 Exudative age-related macular degeneration, bilateral, with inactive choroidal neovascularization: Secondary | ICD-10-CM | POA: Diagnosis not present

## 2016-07-06 DIAGNOSIS — I10 Essential (primary) hypertension: Secondary | ICD-10-CM | POA: Diagnosis not present

## 2016-07-06 DIAGNOSIS — N185 Chronic kidney disease, stage 5: Secondary | ICD-10-CM | POA: Diagnosis not present

## 2016-07-06 DIAGNOSIS — E785 Hyperlipidemia, unspecified: Secondary | ICD-10-CM | POA: Diagnosis not present

## 2016-07-06 DIAGNOSIS — I25119 Atherosclerotic heart disease of native coronary artery with unspecified angina pectoris: Secondary | ICD-10-CM | POA: Diagnosis not present

## 2016-07-06 DIAGNOSIS — I350 Nonrheumatic aortic (valve) stenosis: Secondary | ICD-10-CM | POA: Diagnosis not present

## 2016-07-06 DIAGNOSIS — I5040 Unspecified combined systolic (congestive) and diastolic (congestive) heart failure: Secondary | ICD-10-CM | POA: Diagnosis not present

## 2016-07-06 DIAGNOSIS — Z85820 Personal history of malignant melanoma of skin: Secondary | ICD-10-CM | POA: Diagnosis not present

## 2016-07-06 DIAGNOSIS — Z08 Encounter for follow-up examination after completed treatment for malignant neoplasm: Secondary | ICD-10-CM | POA: Diagnosis not present

## 2016-07-12 DIAGNOSIS — I10 Essential (primary) hypertension: Secondary | ICD-10-CM | POA: Diagnosis not present

## 2016-07-12 DIAGNOSIS — I5040 Unspecified combined systolic (congestive) and diastolic (congestive) heart failure: Secondary | ICD-10-CM | POA: Diagnosis not present

## 2016-07-12 DIAGNOSIS — I350 Nonrheumatic aortic (valve) stenosis: Secondary | ICD-10-CM | POA: Diagnosis not present

## 2016-07-12 DIAGNOSIS — E785 Hyperlipidemia, unspecified: Secondary | ICD-10-CM | POA: Diagnosis not present

## 2016-07-12 DIAGNOSIS — N185 Chronic kidney disease, stage 5: Secondary | ICD-10-CM | POA: Diagnosis not present

## 2016-07-12 DIAGNOSIS — I25119 Atherosclerotic heart disease of native coronary artery with unspecified angina pectoris: Secondary | ICD-10-CM | POA: Diagnosis not present

## 2016-07-13 DIAGNOSIS — I5040 Unspecified combined systolic (congestive) and diastolic (congestive) heart failure: Secondary | ICD-10-CM | POA: Diagnosis not present

## 2016-07-13 DIAGNOSIS — E785 Hyperlipidemia, unspecified: Secondary | ICD-10-CM | POA: Diagnosis not present

## 2016-07-13 DIAGNOSIS — I350 Nonrheumatic aortic (valve) stenosis: Secondary | ICD-10-CM | POA: Diagnosis not present

## 2016-07-13 DIAGNOSIS — I10 Essential (primary) hypertension: Secondary | ICD-10-CM | POA: Diagnosis not present

## 2016-07-13 DIAGNOSIS — N185 Chronic kidney disease, stage 5: Secondary | ICD-10-CM | POA: Diagnosis not present

## 2016-07-13 DIAGNOSIS — I25119 Atherosclerotic heart disease of native coronary artery with unspecified angina pectoris: Secondary | ICD-10-CM | POA: Diagnosis not present

## 2016-07-17 DIAGNOSIS — I10 Essential (primary) hypertension: Secondary | ICD-10-CM | POA: Diagnosis not present

## 2016-07-17 DIAGNOSIS — I350 Nonrheumatic aortic (valve) stenosis: Secondary | ICD-10-CM | POA: Diagnosis not present

## 2016-07-17 DIAGNOSIS — N184 Chronic kidney disease, stage 4 (severe): Secondary | ICD-10-CM | POA: Diagnosis not present

## 2016-07-20 DIAGNOSIS — I350 Nonrheumatic aortic (valve) stenosis: Secondary | ICD-10-CM | POA: Diagnosis not present

## 2016-07-20 DIAGNOSIS — N185 Chronic kidney disease, stage 5: Secondary | ICD-10-CM | POA: Diagnosis not present

## 2016-07-20 DIAGNOSIS — I10 Essential (primary) hypertension: Secondary | ICD-10-CM | POA: Diagnosis not present

## 2016-07-20 DIAGNOSIS — I25119 Atherosclerotic heart disease of native coronary artery with unspecified angina pectoris: Secondary | ICD-10-CM | POA: Diagnosis not present

## 2016-07-20 DIAGNOSIS — I5040 Unspecified combined systolic (congestive) and diastolic (congestive) heart failure: Secondary | ICD-10-CM | POA: Diagnosis not present

## 2016-07-20 DIAGNOSIS — E785 Hyperlipidemia, unspecified: Secondary | ICD-10-CM | POA: Diagnosis not present

## 2016-07-26 DIAGNOSIS — I10 Essential (primary) hypertension: Secondary | ICD-10-CM | POA: Diagnosis not present

## 2016-07-26 DIAGNOSIS — I350 Nonrheumatic aortic (valve) stenosis: Secondary | ICD-10-CM | POA: Diagnosis not present

## 2016-07-26 DIAGNOSIS — E785 Hyperlipidemia, unspecified: Secondary | ICD-10-CM | POA: Diagnosis not present

## 2016-07-26 DIAGNOSIS — N185 Chronic kidney disease, stage 5: Secondary | ICD-10-CM | POA: Diagnosis not present

## 2016-07-26 DIAGNOSIS — I5040 Unspecified combined systolic (congestive) and diastolic (congestive) heart failure: Secondary | ICD-10-CM | POA: Diagnosis not present

## 2016-07-26 DIAGNOSIS — I25119 Atherosclerotic heart disease of native coronary artery with unspecified angina pectoris: Secondary | ICD-10-CM | POA: Diagnosis not present

## 2016-07-27 DIAGNOSIS — I5040 Unspecified combined systolic (congestive) and diastolic (congestive) heart failure: Secondary | ICD-10-CM | POA: Diagnosis not present

## 2016-07-27 DIAGNOSIS — I25119 Atherosclerotic heart disease of native coronary artery with unspecified angina pectoris: Secondary | ICD-10-CM | POA: Diagnosis not present

## 2016-07-27 DIAGNOSIS — I10 Essential (primary) hypertension: Secondary | ICD-10-CM | POA: Diagnosis not present

## 2016-07-27 DIAGNOSIS — I350 Nonrheumatic aortic (valve) stenosis: Secondary | ICD-10-CM | POA: Diagnosis not present

## 2016-07-27 DIAGNOSIS — N185 Chronic kidney disease, stage 5: Secondary | ICD-10-CM | POA: Diagnosis not present

## 2016-07-27 DIAGNOSIS — E785 Hyperlipidemia, unspecified: Secondary | ICD-10-CM | POA: Diagnosis not present

## 2016-07-28 DIAGNOSIS — R630 Anorexia: Secondary | ICD-10-CM | POA: Diagnosis not present

## 2016-07-28 DIAGNOSIS — I5040 Unspecified combined systolic (congestive) and diastolic (congestive) heart failure: Secondary | ICD-10-CM | POA: Diagnosis not present

## 2016-07-28 DIAGNOSIS — H3530 Unspecified macular degeneration: Secondary | ICD-10-CM | POA: Diagnosis not present

## 2016-07-28 DIAGNOSIS — R634 Abnormal weight loss: Secondary | ICD-10-CM | POA: Diagnosis not present

## 2016-07-28 DIAGNOSIS — N185 Chronic kidney disease, stage 5: Secondary | ICD-10-CM | POA: Diagnosis not present

## 2016-07-28 DIAGNOSIS — R0600 Dyspnea, unspecified: Secondary | ICD-10-CM | POA: Diagnosis not present

## 2016-07-28 DIAGNOSIS — D631 Anemia in chronic kidney disease: Secondary | ICD-10-CM | POA: Diagnosis not present

## 2016-07-28 DIAGNOSIS — I25119 Atherosclerotic heart disease of native coronary artery with unspecified angina pectoris: Secondary | ICD-10-CM | POA: Diagnosis not present

## 2016-07-28 DIAGNOSIS — I350 Nonrheumatic aortic (valve) stenosis: Secondary | ICD-10-CM | POA: Diagnosis not present

## 2016-07-28 DIAGNOSIS — M1990 Unspecified osteoarthritis, unspecified site: Secondary | ICD-10-CM | POA: Diagnosis not present

## 2016-07-28 DIAGNOSIS — I10 Essential (primary) hypertension: Secondary | ICD-10-CM | POA: Diagnosis not present

## 2016-07-28 DIAGNOSIS — E785 Hyperlipidemia, unspecified: Secondary | ICD-10-CM | POA: Diagnosis not present

## 2016-07-28 DIAGNOSIS — I6523 Occlusion and stenosis of bilateral carotid arteries: Secondary | ICD-10-CM | POA: Diagnosis not present

## 2016-07-31 DIAGNOSIS — I25119 Atherosclerotic heart disease of native coronary artery with unspecified angina pectoris: Secondary | ICD-10-CM | POA: Diagnosis not present

## 2016-07-31 DIAGNOSIS — I10 Essential (primary) hypertension: Secondary | ICD-10-CM | POA: Diagnosis not present

## 2016-07-31 DIAGNOSIS — I350 Nonrheumatic aortic (valve) stenosis: Secondary | ICD-10-CM | POA: Diagnosis not present

## 2016-07-31 DIAGNOSIS — N185 Chronic kidney disease, stage 5: Secondary | ICD-10-CM | POA: Diagnosis not present

## 2016-07-31 DIAGNOSIS — E785 Hyperlipidemia, unspecified: Secondary | ICD-10-CM | POA: Diagnosis not present

## 2016-07-31 DIAGNOSIS — I5040 Unspecified combined systolic (congestive) and diastolic (congestive) heart failure: Secondary | ICD-10-CM | POA: Diagnosis not present

## 2016-08-03 DIAGNOSIS — N185 Chronic kidney disease, stage 5: Secondary | ICD-10-CM | POA: Diagnosis not present

## 2016-08-03 DIAGNOSIS — I5040 Unspecified combined systolic (congestive) and diastolic (congestive) heart failure: Secondary | ICD-10-CM | POA: Diagnosis not present

## 2016-08-03 DIAGNOSIS — I10 Essential (primary) hypertension: Secondary | ICD-10-CM | POA: Diagnosis not present

## 2016-08-03 DIAGNOSIS — I350 Nonrheumatic aortic (valve) stenosis: Secondary | ICD-10-CM | POA: Diagnosis not present

## 2016-08-03 DIAGNOSIS — I25119 Atherosclerotic heart disease of native coronary artery with unspecified angina pectoris: Secondary | ICD-10-CM | POA: Diagnosis not present

## 2016-08-03 DIAGNOSIS — E785 Hyperlipidemia, unspecified: Secondary | ICD-10-CM | POA: Diagnosis not present

## 2016-08-04 DIAGNOSIS — E785 Hyperlipidemia, unspecified: Secondary | ICD-10-CM | POA: Diagnosis not present

## 2016-08-04 DIAGNOSIS — I350 Nonrheumatic aortic (valve) stenosis: Secondary | ICD-10-CM | POA: Diagnosis not present

## 2016-08-04 DIAGNOSIS — N185 Chronic kidney disease, stage 5: Secondary | ICD-10-CM | POA: Diagnosis not present

## 2016-08-04 DIAGNOSIS — I25119 Atherosclerotic heart disease of native coronary artery with unspecified angina pectoris: Secondary | ICD-10-CM | POA: Diagnosis not present

## 2016-08-04 DIAGNOSIS — I10 Essential (primary) hypertension: Secondary | ICD-10-CM | POA: Diagnosis not present

## 2016-08-04 DIAGNOSIS — I5040 Unspecified combined systolic (congestive) and diastolic (congestive) heart failure: Secondary | ICD-10-CM | POA: Diagnosis not present

## 2016-08-08 DIAGNOSIS — E785 Hyperlipidemia, unspecified: Secondary | ICD-10-CM | POA: Diagnosis not present

## 2016-08-08 DIAGNOSIS — I25119 Atherosclerotic heart disease of native coronary artery with unspecified angina pectoris: Secondary | ICD-10-CM | POA: Diagnosis not present

## 2016-08-08 DIAGNOSIS — I5040 Unspecified combined systolic (congestive) and diastolic (congestive) heart failure: Secondary | ICD-10-CM | POA: Diagnosis not present

## 2016-08-08 DIAGNOSIS — I350 Nonrheumatic aortic (valve) stenosis: Secondary | ICD-10-CM | POA: Diagnosis not present

## 2016-08-08 DIAGNOSIS — I10 Essential (primary) hypertension: Secondary | ICD-10-CM | POA: Diagnosis not present

## 2016-08-08 DIAGNOSIS — N185 Chronic kidney disease, stage 5: Secondary | ICD-10-CM | POA: Diagnosis not present

## 2016-08-14 DIAGNOSIS — I5040 Unspecified combined systolic (congestive) and diastolic (congestive) heart failure: Secondary | ICD-10-CM | POA: Diagnosis not present

## 2016-08-14 DIAGNOSIS — I350 Nonrheumatic aortic (valve) stenosis: Secondary | ICD-10-CM | POA: Diagnosis not present

## 2016-08-14 DIAGNOSIS — N185 Chronic kidney disease, stage 5: Secondary | ICD-10-CM | POA: Diagnosis not present

## 2016-08-14 DIAGNOSIS — I25119 Atherosclerotic heart disease of native coronary artery with unspecified angina pectoris: Secondary | ICD-10-CM | POA: Diagnosis not present

## 2016-08-14 DIAGNOSIS — I10 Essential (primary) hypertension: Secondary | ICD-10-CM | POA: Diagnosis not present

## 2016-08-14 DIAGNOSIS — E785 Hyperlipidemia, unspecified: Secondary | ICD-10-CM | POA: Diagnosis not present

## 2016-08-17 DIAGNOSIS — I5040 Unspecified combined systolic (congestive) and diastolic (congestive) heart failure: Secondary | ICD-10-CM | POA: Diagnosis not present

## 2016-08-17 DIAGNOSIS — E785 Hyperlipidemia, unspecified: Secondary | ICD-10-CM | POA: Diagnosis not present

## 2016-08-17 DIAGNOSIS — I350 Nonrheumatic aortic (valve) stenosis: Secondary | ICD-10-CM | POA: Diagnosis not present

## 2016-08-17 DIAGNOSIS — I10 Essential (primary) hypertension: Secondary | ICD-10-CM | POA: Diagnosis not present

## 2016-08-17 DIAGNOSIS — N185 Chronic kidney disease, stage 5: Secondary | ICD-10-CM | POA: Diagnosis not present

## 2016-08-17 DIAGNOSIS — I25119 Atherosclerotic heart disease of native coronary artery with unspecified angina pectoris: Secondary | ICD-10-CM | POA: Diagnosis not present

## 2016-08-24 DIAGNOSIS — I5040 Unspecified combined systolic (congestive) and diastolic (congestive) heart failure: Secondary | ICD-10-CM | POA: Diagnosis not present

## 2016-08-24 DIAGNOSIS — N185 Chronic kidney disease, stage 5: Secondary | ICD-10-CM | POA: Diagnosis not present

## 2016-08-24 DIAGNOSIS — M7741 Metatarsalgia, right foot: Secondary | ICD-10-CM | POA: Diagnosis not present

## 2016-08-24 DIAGNOSIS — E785 Hyperlipidemia, unspecified: Secondary | ICD-10-CM | POA: Diagnosis not present

## 2016-08-24 DIAGNOSIS — I25119 Atherosclerotic heart disease of native coronary artery with unspecified angina pectoris: Secondary | ICD-10-CM | POA: Diagnosis not present

## 2016-08-24 DIAGNOSIS — I350 Nonrheumatic aortic (valve) stenosis: Secondary | ICD-10-CM | POA: Diagnosis not present

## 2016-08-24 DIAGNOSIS — M7751 Other enthesopathy of right foot: Secondary | ICD-10-CM | POA: Diagnosis not present

## 2016-08-24 DIAGNOSIS — I10 Essential (primary) hypertension: Secondary | ICD-10-CM | POA: Diagnosis not present

## 2016-08-27 DIAGNOSIS — I5040 Unspecified combined systolic (congestive) and diastolic (congestive) heart failure: Secondary | ICD-10-CM | POA: Diagnosis not present

## 2016-08-27 DIAGNOSIS — R634 Abnormal weight loss: Secondary | ICD-10-CM | POA: Diagnosis not present

## 2016-08-27 DIAGNOSIS — I10 Essential (primary) hypertension: Secondary | ICD-10-CM | POA: Diagnosis not present

## 2016-08-27 DIAGNOSIS — I25119 Atherosclerotic heart disease of native coronary artery with unspecified angina pectoris: Secondary | ICD-10-CM | POA: Diagnosis not present

## 2016-08-27 DIAGNOSIS — R0600 Dyspnea, unspecified: Secondary | ICD-10-CM | POA: Diagnosis not present

## 2016-08-27 DIAGNOSIS — I6523 Occlusion and stenosis of bilateral carotid arteries: Secondary | ICD-10-CM | POA: Diagnosis not present

## 2016-08-27 DIAGNOSIS — H3530 Unspecified macular degeneration: Secondary | ICD-10-CM | POA: Diagnosis not present

## 2016-08-27 DIAGNOSIS — N185 Chronic kidney disease, stage 5: Secondary | ICD-10-CM | POA: Diagnosis not present

## 2016-08-27 DIAGNOSIS — R630 Anorexia: Secondary | ICD-10-CM | POA: Diagnosis not present

## 2016-08-27 DIAGNOSIS — I350 Nonrheumatic aortic (valve) stenosis: Secondary | ICD-10-CM | POA: Diagnosis not present

## 2016-08-27 DIAGNOSIS — E785 Hyperlipidemia, unspecified: Secondary | ICD-10-CM | POA: Diagnosis not present

## 2016-08-27 DIAGNOSIS — M1990 Unspecified osteoarthritis, unspecified site: Secondary | ICD-10-CM | POA: Diagnosis not present

## 2016-08-27 DIAGNOSIS — D631 Anemia in chronic kidney disease: Secondary | ICD-10-CM | POA: Diagnosis not present

## 2016-08-31 DIAGNOSIS — I5040 Unspecified combined systolic (congestive) and diastolic (congestive) heart failure: Secondary | ICD-10-CM | POA: Diagnosis not present

## 2016-08-31 DIAGNOSIS — I25119 Atherosclerotic heart disease of native coronary artery with unspecified angina pectoris: Secondary | ICD-10-CM | POA: Diagnosis not present

## 2016-08-31 DIAGNOSIS — E785 Hyperlipidemia, unspecified: Secondary | ICD-10-CM | POA: Diagnosis not present

## 2016-08-31 DIAGNOSIS — I10 Essential (primary) hypertension: Secondary | ICD-10-CM | POA: Diagnosis not present

## 2016-08-31 DIAGNOSIS — N185 Chronic kidney disease, stage 5: Secondary | ICD-10-CM | POA: Diagnosis not present

## 2016-08-31 DIAGNOSIS — I350 Nonrheumatic aortic (valve) stenosis: Secondary | ICD-10-CM | POA: Diagnosis not present

## 2016-09-08 DIAGNOSIS — N185 Chronic kidney disease, stage 5: Secondary | ICD-10-CM | POA: Diagnosis not present

## 2016-09-08 DIAGNOSIS — E785 Hyperlipidemia, unspecified: Secondary | ICD-10-CM | POA: Diagnosis not present

## 2016-09-08 DIAGNOSIS — I10 Essential (primary) hypertension: Secondary | ICD-10-CM | POA: Diagnosis not present

## 2016-09-08 DIAGNOSIS — I5040 Unspecified combined systolic (congestive) and diastolic (congestive) heart failure: Secondary | ICD-10-CM | POA: Diagnosis not present

## 2016-09-08 DIAGNOSIS — I25119 Atherosclerotic heart disease of native coronary artery with unspecified angina pectoris: Secondary | ICD-10-CM | POA: Diagnosis not present

## 2016-09-08 DIAGNOSIS — I350 Nonrheumatic aortic (valve) stenosis: Secondary | ICD-10-CM | POA: Diagnosis not present

## 2016-09-10 DIAGNOSIS — N185 Chronic kidney disease, stage 5: Secondary | ICD-10-CM | POA: Diagnosis not present

## 2016-09-10 DIAGNOSIS — I10 Essential (primary) hypertension: Secondary | ICD-10-CM | POA: Diagnosis not present

## 2016-09-10 DIAGNOSIS — E785 Hyperlipidemia, unspecified: Secondary | ICD-10-CM | POA: Diagnosis not present

## 2016-09-10 DIAGNOSIS — I350 Nonrheumatic aortic (valve) stenosis: Secondary | ICD-10-CM | POA: Diagnosis not present

## 2016-09-10 DIAGNOSIS — I5040 Unspecified combined systolic (congestive) and diastolic (congestive) heart failure: Secondary | ICD-10-CM | POA: Diagnosis not present

## 2016-09-10 DIAGNOSIS — I25119 Atherosclerotic heart disease of native coronary artery with unspecified angina pectoris: Secondary | ICD-10-CM | POA: Diagnosis not present

## 2016-09-14 DIAGNOSIS — E785 Hyperlipidemia, unspecified: Secondary | ICD-10-CM | POA: Diagnosis not present

## 2016-09-14 DIAGNOSIS — I25119 Atherosclerotic heart disease of native coronary artery with unspecified angina pectoris: Secondary | ICD-10-CM | POA: Diagnosis not present

## 2016-09-14 DIAGNOSIS — I5040 Unspecified combined systolic (congestive) and diastolic (congestive) heart failure: Secondary | ICD-10-CM | POA: Diagnosis not present

## 2016-09-14 DIAGNOSIS — N185 Chronic kidney disease, stage 5: Secondary | ICD-10-CM | POA: Diagnosis not present

## 2016-09-14 DIAGNOSIS — I10 Essential (primary) hypertension: Secondary | ICD-10-CM | POA: Diagnosis not present

## 2016-09-14 DIAGNOSIS — I350 Nonrheumatic aortic (valve) stenosis: Secondary | ICD-10-CM | POA: Diagnosis not present

## 2016-09-21 DIAGNOSIS — E785 Hyperlipidemia, unspecified: Secondary | ICD-10-CM | POA: Diagnosis not present

## 2016-09-21 DIAGNOSIS — N185 Chronic kidney disease, stage 5: Secondary | ICD-10-CM | POA: Diagnosis not present

## 2016-09-21 DIAGNOSIS — I10 Essential (primary) hypertension: Secondary | ICD-10-CM | POA: Diagnosis not present

## 2016-09-21 DIAGNOSIS — I25119 Atherosclerotic heart disease of native coronary artery with unspecified angina pectoris: Secondary | ICD-10-CM | POA: Diagnosis not present

## 2016-09-21 DIAGNOSIS — I350 Nonrheumatic aortic (valve) stenosis: Secondary | ICD-10-CM | POA: Diagnosis not present

## 2016-09-21 DIAGNOSIS — I5040 Unspecified combined systolic (congestive) and diastolic (congestive) heart failure: Secondary | ICD-10-CM | POA: Diagnosis not present

## 2016-09-24 ENCOUNTER — Encounter (HOSPITAL_COMMUNITY): Payer: Self-pay | Admitting: Emergency Medicine

## 2016-09-24 ENCOUNTER — Observation Stay (HOSPITAL_COMMUNITY)
Admission: EM | Admit: 2016-09-24 | Discharge: 2016-09-25 | Disposition: A | Discharge status: Home / Self Care | Attending: Internal Medicine | Admitting: Internal Medicine

## 2016-09-24 ENCOUNTER — Emergency Department (HOSPITAL_COMMUNITY)

## 2016-09-24 DIAGNOSIS — F419 Anxiety disorder, unspecified: Secondary | ICD-10-CM | POA: Insufficient documentation

## 2016-09-24 DIAGNOSIS — R748 Abnormal levels of other serum enzymes: Secondary | ICD-10-CM

## 2016-09-24 DIAGNOSIS — R55 Syncope and collapse: Secondary | ICD-10-CM | POA: Diagnosis not present

## 2016-09-24 DIAGNOSIS — H3530 Unspecified macular degeneration: Secondary | ICD-10-CM | POA: Insufficient documentation

## 2016-09-24 DIAGNOSIS — I2510 Atherosclerotic heart disease of native coronary artery without angina pectoris: Secondary | ICD-10-CM | POA: Diagnosis not present

## 2016-09-24 DIAGNOSIS — I5040 Unspecified combined systolic (congestive) and diastolic (congestive) heart failure: Secondary | ICD-10-CM | POA: Diagnosis not present

## 2016-09-24 DIAGNOSIS — I739 Peripheral vascular disease, unspecified: Secondary | ICD-10-CM

## 2016-09-24 DIAGNOSIS — I214 Non-ST elevation (NSTEMI) myocardial infarction: Secondary | ICD-10-CM | POA: Diagnosis not present

## 2016-09-24 DIAGNOSIS — I10 Essential (primary) hypertension: Secondary | ICD-10-CM | POA: Diagnosis not present

## 2016-09-24 DIAGNOSIS — D631 Anemia in chronic kidney disease: Secondary | ICD-10-CM | POA: Diagnosis not present

## 2016-09-24 DIAGNOSIS — I25119 Atherosclerotic heart disease of native coronary artery with unspecified angina pectoris: Secondary | ICD-10-CM | POA: Diagnosis not present

## 2016-09-24 DIAGNOSIS — Z880 Allergy status to penicillin: Secondary | ICD-10-CM | POA: Insufficient documentation

## 2016-09-24 DIAGNOSIS — Z7982 Long term (current) use of aspirin: Secondary | ICD-10-CM | POA: Insufficient documentation

## 2016-09-24 DIAGNOSIS — I779 Disorder of arteries and arterioles, unspecified: Secondary | ICD-10-CM | POA: Diagnosis present

## 2016-09-24 DIAGNOSIS — Z66 Do not resuscitate: Secondary | ICD-10-CM | POA: Insufficient documentation

## 2016-09-24 DIAGNOSIS — N185 Chronic kidney disease, stage 5: Secondary | ICD-10-CM | POA: Diagnosis not present

## 2016-09-24 DIAGNOSIS — I132 Hypertensive heart and chronic kidney disease with heart failure and with stage 5 chronic kidney disease, or end stage renal disease: Secondary | ICD-10-CM | POA: Insufficient documentation

## 2016-09-24 DIAGNOSIS — R7989 Other specified abnormal findings of blood chemistry: Secondary | ICD-10-CM

## 2016-09-24 DIAGNOSIS — Z515 Encounter for palliative care: Secondary | ICD-10-CM

## 2016-09-24 DIAGNOSIS — I5043 Acute on chronic combined systolic (congestive) and diastolic (congestive) heart failure: Secondary | ICD-10-CM | POA: Diagnosis not present

## 2016-09-24 DIAGNOSIS — I350 Nonrheumatic aortic (valve) stenosis: Secondary | ICD-10-CM | POA: Diagnosis not present

## 2016-09-24 DIAGNOSIS — E785 Hyperlipidemia, unspecified: Secondary | ICD-10-CM | POA: Insufficient documentation

## 2016-09-24 DIAGNOSIS — S0003XD Contusion of scalp, subsequent encounter: Secondary | ICD-10-CM

## 2016-09-24 DIAGNOSIS — Z79899 Other long term (current) drug therapy: Secondary | ICD-10-CM | POA: Diagnosis not present

## 2016-09-24 DIAGNOSIS — I25810 Atherosclerosis of coronary artery bypass graft(s) without angina pectoris: Secondary | ICD-10-CM | POA: Diagnosis present

## 2016-09-24 DIAGNOSIS — R778 Other specified abnormalities of plasma proteins: Secondary | ICD-10-CM

## 2016-09-24 LAB — URINALYSIS, ROUTINE W REFLEX MICROSCOPIC
BILIRUBIN URINE: NEGATIVE
GLUCOSE, UA: NEGATIVE mg/dL
Hgb urine dipstick: NEGATIVE
Ketones, ur: NEGATIVE mg/dL
Leukocytes, UA: NEGATIVE
NITRITE: NEGATIVE
PH: 5 (ref 5.0–8.0)
Protein, ur: NEGATIVE mg/dL
SPECIFIC GRAVITY, URINE: 1.011 (ref 1.005–1.030)

## 2016-09-24 LAB — CBC
HCT: 29.8 % — ABNORMAL LOW (ref 39.0–52.0)
Hemoglobin: 9.7 g/dL — ABNORMAL LOW (ref 13.0–17.0)
MCH: 33.8 pg (ref 26.0–34.0)
MCHC: 32.6 g/dL (ref 30.0–36.0)
MCV: 103.8 fL — AB (ref 78.0–100.0)
PLATELETS: 173 10*3/uL (ref 150–400)
RBC: 2.87 MIL/uL — AB (ref 4.22–5.81)
RDW: 16.5 % — AB (ref 11.5–15.5)
WBC: 8.5 10*3/uL (ref 4.0–10.5)

## 2016-09-24 LAB — I-STAT TROPONIN, ED: TROPONIN I, POC: 0.23 ng/mL — AB (ref 0.00–0.08)

## 2016-09-24 LAB — COMPREHENSIVE METABOLIC PANEL
ALK PHOS: 62 U/L (ref 38–126)
ALT: 18 U/L (ref 17–63)
AST: 36 U/L (ref 15–41)
Albumin: 2.9 g/dL — ABNORMAL LOW (ref 3.5–5.0)
Anion gap: 15 (ref 5–15)
BILIRUBIN TOTAL: 1 mg/dL (ref 0.3–1.2)
BUN: 60 mg/dL — ABNORMAL HIGH (ref 6–20)
CALCIUM: 8.8 mg/dL — AB (ref 8.9–10.3)
CO2: 22 mmol/L (ref 22–32)
CREATININE: 3.24 mg/dL — AB (ref 0.61–1.24)
Chloride: 106 mmol/L (ref 101–111)
GFR, EST AFRICAN AMERICAN: 18 mL/min — AB (ref >60)
GFR, EST NON AFRICAN AMERICAN: 15 mL/min — AB (ref >60)
Glucose, Bld: 131 mg/dL — ABNORMAL HIGH (ref 65–99)
Potassium: 3.8 mmol/L (ref 3.5–5.1)
Sodium: 143 mmol/L (ref 135–145)
TOTAL PROTEIN: 6.5 g/dL (ref 6.5–8.1)

## 2016-09-24 LAB — TROPONIN I
TROPONIN I: 2.15 ng/mL — AB (ref <0.03)
Troponin I: 11.22 ng/mL (ref <0.03)
Troponin I: 16.62 ng/mL (ref <0.03)

## 2016-09-24 LAB — CBG MONITORING, ED: GLUCOSE-CAPILLARY: 117 mg/dL — AB (ref 65–99)

## 2016-09-24 MED ORDER — ASPIRIN 81 MG PO CHEW
243.0000 mg | CHEWABLE_TABLET | Freq: Once | ORAL | Status: AC
  Administered 2016-09-24: 243 mg via ORAL
  Filled 2016-09-24: qty 3

## 2016-09-24 MED ORDER — ASPIRIN EC 81 MG PO TBEC
81.0000 mg | DELAYED_RELEASE_TABLET | Freq: Every day | ORAL | Status: DC
  Administered 2016-09-24: 81 mg via ORAL
  Filled 2016-09-24 (×2): qty 1

## 2016-09-24 MED ORDER — SODIUM CHLORIDE 0.9 % IV SOLN
INTRAVENOUS | Status: DC
  Administered 2016-09-24: 75 mL/h via INTRAVENOUS
  Administered 2016-09-25: 03:00:00 via INTRAVENOUS

## 2016-09-24 MED ORDER — TAMSULOSIN HCL 0.4 MG PO CAPS: 0.4000 mg | ORAL_CAPSULE | Freq: Every day | ORAL | Status: DC

## 2016-09-24 MED ORDER — PROMETHAZINE HCL 25 MG PO TABS: 12.5000 mg | ORAL_TABLET | Freq: Four times a day (QID) | ORAL | Status: DC | PRN

## 2016-09-24 MED ORDER — TAMSULOSIN HCL 0.4 MG PO CAPS
0.4000 mg | ORAL_CAPSULE | Freq: Every day | ORAL | Status: DC
  Administered 2016-09-24: 0.4 mg via ORAL
  Filled 2016-09-24 (×2): qty 1

## 2016-09-24 MED ORDER — SODIUM CHLORIDE 0.9 % IV BOLUS (SEPSIS)
500.0000 mL | Freq: Once | INTRAVENOUS | Status: AC
  Administered 2016-09-24: 500 mL via INTRAVENOUS

## 2016-09-24 MED ORDER — ACETAMINOPHEN 650 MG RE SUPP: 650.0000 mg | Freq: Four times a day (QID) | RECTAL | Status: DC | PRN

## 2016-09-24 MED ORDER — HEPARIN SODIUM (PORCINE) 5000 UNIT/ML IJ SOLN
5000.0000 [IU] | Freq: Three times a day (TID) | INTRAMUSCULAR | Status: DC
  Administered 2016-09-24 – 2016-09-25 (×3): 5000 [IU] via SUBCUTANEOUS
  Filled 2016-09-24 (×4): qty 1

## 2016-09-24 MED ORDER — ACETAMINOPHEN 325 MG PO TABS: 650.0000 mg | ORAL_TABLET | Freq: Four times a day (QID) | ORAL | Status: DC | PRN

## 2016-09-24 MED ORDER — LATANOPROST 0.005 % OP SOLN
1.0000 [drp] | Freq: Every day | OPHTHALMIC | Status: DC
  Administered 2016-09-24: 1 [drp] via OPHTHALMIC
  Filled 2016-09-24: qty 2.5

## 2016-09-24 MED ORDER — FUROSEMIDE 40 MG PO TABS
40.0000 mg | ORAL_TABLET | Freq: Every day | ORAL | Status: DC
  Administered 2016-09-24: 40 mg via ORAL
  Filled 2016-09-24: qty 2
  Filled 2016-09-24: qty 1

## 2016-09-24 MED ORDER — HYDROCODONE-ACETAMINOPHEN 5-325 MG PO TABS: 1.0000 | ORAL_TABLET | ORAL | Status: DC | PRN

## 2016-09-24 MED ORDER — SODIUM CHLORIDE 0.9% FLUSH
3.0000 mL | Freq: Two times a day (BID) | INTRAVENOUS | Status: DC
  Administered 2016-09-24: 3 mL via INTRAVENOUS

## 2016-09-24 MED ORDER — SENNOSIDES-DOCUSATE SODIUM 8.6-50 MG PO TABS: 1.0000 | ORAL_TABLET | Freq: Every evening | ORAL | Status: DC | PRN

## 2016-09-24 MED ORDER — METOPROLOL SUCCINATE ER 25 MG PO TB24
25.0000 mg | ORAL_TABLET | Freq: Every day | ORAL | Status: DC
  Administered 2016-09-24: 25 mg via ORAL
  Filled 2016-09-24 (×2): qty 1
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
## 2016-09-24 NOTE — Progress Notes (Signed)
Troponin 11.22, no s/s, was 2.15, MD notified, will continue to monitor, Thanks Arvella Nigh RN.

## 2016-09-24 NOTE — ED Notes (Signed)
Patient and family requested to speak with Doctor [name]. Doctor who works with Dr [name] at bedside.

## 2016-09-24 NOTE — ED Triage Notes (Addendum)
Arrived via EMS for syncope on a toilet. Patient fell of toilet.  Skin abrasion all 4 extremities and top of head.  EMS reported CBG of 157 Ax4 administered NS 1000ML BP 88/51 and Zofran 4mg .

## 2016-09-24 NOTE — ED Provider Notes (Signed)
[place] DEPT Provider Note   CSN: [id number] Arrival date & time: 09/24/16  0941     History   Chief Complaint Chief Complaint  Patient presents with  . Loss of Consciousness    HPI Shawn Long is a 83 y.o. male.  HPI  83 year old man on hospice care secondary to critical aortic stenosis who is not a candidate for repair, coronary artery disease, stage V kidney disease, chronic anemia who presents today after syncopal episode. He reports being chronically weak however he normally gets around with his walker. Today, he had to sit down on the commode due to weakness while he was trying to get ready this morning. He felt very lightheaded and passed out. He has abrasions to the top of his head bilateral arms and bilateral knees. He is not complaining of pain. He denies being on a blood thinner. He states he has had some changes in his medications. Recently. He states he has had some increase in his fluid pills. He is also on a beta blocker. He denies any headache, head pain, neck pain, chest pain, change in dyspnea, vomiting, or diarrhea. He states he has been eating and drinking as per normal. He denies any signs or symptoms of infection in the past 24 hours.  Past Medical History:  Diagnosis Date  . Aortic valve disorders   . CAD (coronary artery disease) 1998   a. s/p cabg x4 in 1998.  . Carotid arterial disease ([place])    a. prior CEA 1999.  Marland Kitchen Chronic anemia   . Chronic combined systolic and diastolic CHF (congestive heart failure) ([place])    a. EF 45% by echo 2017.  Marland Kitchen Chronic kidney disease (CKD), stage IV (severe) ([place])   . Heart murmur   . HTN (hypertension)   . Hyperlipidemia   . Macular degeneration   . Severe aortic stenosis     Patient Active Problem List   Diagnosis Date Noted  . Chronic combined systolic and diastolic CHF (congestive heart failure) ([place]) 04/28/2016  . Syncope 04/28/2016  . Scalp hematoma, subsequent encounter 04/28/2016  . Acute on chronic  combined systolic and diastolic HF (heart failure) ([place]) 01/23/2016  . Macular degeneration   . HTN (hypertension)   . Hyperlipidemia   . Severe aortic stenosis   . CAD (coronary artery disease) of artery bypass graft   . Carotid arterial disease ([place])   . CKD (chronic kidney disease), stage V [facility])     Past Surgical History:  Procedure Laterality Date  . CAROTID ENDARTERECTOMY, LEFT  03/11/1997   Dr. Kellie Simmering  . CORONARY ARTERY BYPASS GRAFT  01/15/97   CABGx4 by Dr Redmond Pulling (LIMA to diag + LAD, SVG to OM1 + OM2)  . left knee arthroscopic sugery    . right pointer finger surgery secondary to industrial accident         Home Medications    Prior to Admission medications   Medication Sig Start Date End Date Taking? Authorizing Provider  acetaminophen (TYLENOL) 325 MG tablet Take 325 mg by mouth 2 (two) times daily as needed for mild pain.    [provider]  aspirin 81 MG tablet Take 81 mg by mouth daily.      [provider]  furosemide (LASIX) 40 MG tablet Take 2 tablets (80 mg total) by mouth daily. 06/29/16   Belva Crome, MD  latanoprost (XALATAN) 0.005 % ophthalmic solution Place 1 drop into both eyes at bedtime.     [provider]  Menthol, Topical Analgesic, (BIOFREEZE EX) Apply 1 application topically See admin instructions. To the back daily as needed for arthritis    [provider]  metoprolol succinate (TOPROL-XL) 50 MG 24 hr tablet Take 50 mg by mouth daily.     [provider]  mineral oil-hydrophilic petrolatum (AQUAPHOR) ointment Apply 1 application topically daily as needed for dry skin. TO HEELS    [provider]  Misc Natural Products (OSTEO BI-FLEX ADV JOINT SHIELD PO) Take 1 tablet by mouth daily.     [provider]  Multiple Vitamins-Minerals (MACUVITE EYE CARE) TABS Take 2 tablets by mouth 2 (two) times daily.     [provider]  SIMBRINZA 1-0.2 % SUSP Place 1 drop into both eyes every  morning.  05/28/15   [provider]  simvastatin (ZOCOR) 40 MG tablet Take 40 mg by mouth every evening.  03/21/16   [provider]    Family History Family History  Problem Relation Age of Onset  . Cancer - Ovarian Mother     Social History Social History  Substance Use Topics  . Smoking status: Never Smoker  . Smokeless tobacco: Never Used  . Alcohol use Yes     Comment: 4oz wine per day     Allergies   Penicillins   Review of Systems Review of Systems  All other systems reviewed and are negative.    Physical Exam Updated Vital Signs BP 95/64   Pulse (!) 115   Temp 97.8 F (36.6 C) (Oral)   Resp 16   Ht 1.676 m (5\' 6" )   Wt 61.2 kg (135 lb)   SpO2 96%   BMI 21.79 kg/m   Physical Exam  Constitutional: He is oriented to person, place, and time. He appears well-developed and well-nourished. No distress.  HENT:  Head: Normocephalic.  Right Ear: External ear normal.  Left Ear: External ear normal.  Nose: Nose normal.  Mouth/Throat: Oropharynx is clear and moist.  Abrasion top of head.  Eyes: Pupils are equal, round, and reactive to light. EOM are normal.  Neck: Normal range of motion. Neck supple.  Cardiovascular: Normal rate and regular rhythm.   Pulmonary/Chest: Effort normal.  Abdominal: Soft. Bowel sounds are normal.  Musculoskeletal: He exhibits no tenderness or deformity.  Abrasions and skin tears bilateral forearm and bilateral knees  Neurological: He is alert and oriented to person, place, and time.  Skin: Skin is warm and dry. Capillary refill takes less than 2 seconds. There is pallor.  Pulses are decreased throughout  Psychiatric: He has a normal mood and affect.  Nursing note and vitals reviewed.    ED Treatments / Results  Labs (all labs ordered are listed, but only abnormal results are displayed) Labs Reviewed  CBG MONITORING, ED - Abnormal; Notable for the following:       Result Value   Glucose-Capillary 117 (*)      All other components within normal limits  CBC  COMPREHENSIVE METABOLIC PANEL  URINALYSIS, ROUTINE W REFLEX MICROSCOPIC  CBG MONITORING, ED  I-STAT TROPONIN, ED    EKG  EKG Interpretation  Date/Time:  Sunday September 24 2016 09:41:47 EDT Ventricular Rate:  106 PR Interval:    QRS Duration: 129 QT Interval:  375 QTC Calculation: 498 R Axis:   66 Text Interpretation:  regulalr rhythm, pacemaker indeterminate inferior and lateral repolarization abnormality Confirmed by Pattricia Boss [phone]) on 09/24/2016 10:04:09 AM       Radiology Dg Chest 2  View  Result Date: 09/24/2016 CLINICAL DATA:  Syncope.  Fall EXAM: CHEST  2 VIEW COMPARISON:  06/06/2016 FINDINGS: Prior CABG. Elevation of the right hemidiaphragm with small to moderate right pleural effusion and right lower lobe atelectasis or infiltrate. No confluent opacity or effusion on the left. No acute bony abnormality. Heart is mildly enlarged. IMPRESSION: Elevated right hemidiaphragm with small to moderate right effusion and right lower lobe atelectasis or infiltrate. Findings similar to prior study. Electronically Signed   By: Rolm Baptise M.D.   On: 09/24/2016 11:39   Ct Head Wo Contrast  Result Date: 09/24/2016 CLINICAL DATA:  Patient fell skin abrasion all 4 extremities and top of head. EXAM: CT HEAD WITHOUT CONTRAST TECHNIQUE: Contiguous axial images were obtained from the base of the skull through the vertex without intravenous contrast. COMPARISON:  04/28/2016 FINDINGS: Brain: Diffuse parenchymal atrophy. Patchy areas of hypoattenuation in deep and periventricular white matter bilaterally. Negative for acute intracranial hemorrhage, mass lesion, acute infarction, midline shift, or mass-effect. Acute infarct may be inapparent on noncontrast CT. Ventricles and sulci symmetric. Vascular: Atherosclerotic and physiologic intracranial calcifications. Skull: Normal. Negative for fracture or focal lesion. Sinuses/Orbits: No acute finding.  Other: None. IMPRESSION: 1.  Negative for bleed or other acute intracranial process. 2. Atrophy and nonspecific white matter changes. Electronically Signed   By: Lucrezia Europe M.D.   On: 09/24/2016 11:02    Procedures Procedures (including critical care time)  Medications Ordered in ED Medications  sodium chloride 0.9 % bolus 500 mL (500 mLs Intravenous New Bag/Given 09/24/16 0955)     Initial Impression / Assessment and Plan / ED Course  I have reviewed the triage vital signs and the nursing notes.  Pertinent labs & imaging results that were available during my care of the patient were reviewed by me and considered in my medical decision making (see chart for details).     1- syncope- patient with weakness and syncope today.  Known aortic valve insufficency which may be source, but also tachcardiac and could have been due to hypotension.  Cannot rule out arrhythmia.   2- elevated troponin-discussed with Dr. Domenic Polite, cards will consult  3- renal failure- appears stable. 4-anemia- stable 5- hospice care- patient DNR.  Discussed alternative care with decreased aggressive diagnostics, however patient still adamant that he wants cardiology evaluation.   Final Clinical Impressions(s) / ED Diagnoses   Final diagnoses:  Syncope, unspecified syncope type  Elevated troponin    New Prescriptions New Prescriptions   No medications on file     Pattricia Boss, MD 09/24/16 1238

## 2016-09-24 NOTE — Progress Notes (Signed)
Paged regarding uptrending troponin from 0.23-->2.15-->11.22. Patient asymptomatic currently. Admitted for monitoring after fall at home in the setting of critical inoperable aortic stenosis.   Given the patient's DNR status and the fact that he was receiving hospice services prior to admission with the primary goal of minimizing discomfort, I do not believe that the risks associated with starting a heparin gtt or a P2Y12 antagonist are outweighed by potential benefits. It appears as though the patient is having a Type I NSTEMI, however in the setting of a recent fall aggressive anticoagulation puts him at risk for complications such as major bleeding. If bleeding occurred this could cause substantial discomfort and could ultimately be fatal. Given that he is free of ischemic symptoms, I do not believe that starting heparin or a P2Y12 antagonist carries any potential benefit. There are no data to suggest that these therapies confer any morbidity or mortality benefit in an elderly population with many competing comorbidities such as severe AS, age > 65, and ESRD.   I will prescribe 324 mg aspirin now followed by continuation of his normal dose of 81mg  daily. If the patient develops any chest discomfort or shortness of breath, we will focus on palliation of his symptoms per his previously stated wishes.   Lanna Poche, MD Cardiology fellow

## 2016-09-24 NOTE — ED Notes (Signed)
Doctor at bedside.

## 2016-09-24 NOTE — ED Notes (Signed)
Admit Doctor at bedside.  

## 2016-09-24 NOTE — ED Notes (Signed)
Family at bedside. 

## 2016-09-24 NOTE — H&P (Signed)
History and Physical    Shawn Long NTI:[id number] DOB: [birth year] DOA: 09/24/2016   PCP: [name], MD   Patient coming from:  Home    Chief Complaint: Syncope  HPI: Shawn Long is a 83 y.o. male under hospice care, due to critical aortic stenosis, not a candidate for repair, CAD, stage V kidney disease, chronic anemia, presenting today after a syncopal episode. The patient has been weak on a regular basis, but he does normally get around with his walker. He was sitting on the bathroom stool today due to generalized weakness as it was trying to get ready this morning, feeling very lightheaded, and losing consciousness. He had abrasions on top of his head, and in both arms in both knees. He has no complaining of pain. He denies taking a blood thinner. The patient has recently had some changes in his medications, including a increase in his diuretics since May  and beta blockers. He denies any headaches, head pain, neck pain, or chest pain. He denies any changes with dyspnea. He denies any vomiting or diarrhea. No recent infections. The patient has been eating and drinking normally. He denies any dysuria, or gross hematuria.Denies significant leg swelling   ED Course:  BP 99/71 (BP Location: Right Arm)   Pulse 100   Temp 97.8 F (36.6 C) (Oral)   Resp 18   Ht 5\' 6"  (1.676 m)   Wt 61.2 kg (135 lb)   SpO2 100%   BMI 21.79 kg/m    white count 8.5 hemoglobin 9.7 platelets 173 sodium 143 potassium 3.8 BUN 60 creatinine 3.24 calcium 8.8 glucose 131 CT of the head negative for bleed, or masses, or intracranial process chest x-ray without changes from prior, with elevated hemidiaphragm, small to moderate effusion and right lower lobe atelectasis EKG shows regulalr rhythm  received 1 L of normal saline cardiology is to consult   Review of Systems:  As per HPI otherwise all other systems reviewed and are negative  Past Medical History:  Diagnosis Date  . Aortic valve disorders   .  CAD (coronary artery disease) 1998   a. s/p cabg x4 in 1998.  . Carotid arterial disease ([place])    a. prior CEA 1999.  Marland Kitchen Chronic anemia   . Chronic combined systolic and diastolic CHF (congestive heart failure) ([place])    a. EF 45% by echo 2017.  Marland Kitchen Chronic kidney disease (CKD), stage IV (severe) ([place])   . Heart murmur   . HTN (hypertension)   . Hyperlipidemia   . Macular degeneration   . Severe aortic stenosis     Past Surgical History:  Procedure Laterality Date  . CAROTID ENDARTERECTOMY, LEFT  03/11/1997   Dr. Kellie Simmering  . CORONARY ARTERY BYPASS GRAFT  01/15/97   CABGx4 by Dr Redmond Pulling (LIMA to diag + LAD, SVG to OM1 + OM2)  . left knee arthroscopic sugery    . right pointer finger surgery secondary to industrial accident      Social History Social History   Social History  . Marital status: Married    Spouse name: N/A  . Number of children: 3  . Years of education: N/A   Occupational History  . unemployed, retired     Social History Main Topics  . Smoking status: Never Smoker  . Smokeless tobacco: Never Used  . Alcohol use Yes     Comment: 4oz wine per day  . Drug use: No  . Sexual activity: Not on file  Other Topics Concern  . Not on file   Social History Narrative  . No narrative on file     Allergies  Allergen Reactions  . Penicillins Rash    Has patient had a PCN reaction causing immediate rash, facial/tongue/throat swelling, SOB or lightheadedness with hypotension: Yes Has patient had a PCN reaction causing severe rash involving mucus membranes or skin necrosis: No Has patient had a PCN reaction that required hospitalization: No Has patient had a PCN reaction occurring within the last 10 years: No If all of the above answers are "NO", then may proceed with Cephalosporin use.    Family History  Problem Relation Age of Onset  . Cancer - Ovarian Mother       Prior to Admission medications   Medication Sig Start Date End Date Taking? Authorizing  Provider  acetaminophen (TYLENOL) 325 MG tablet Take 325 mg by mouth 2 (two) times daily as needed for mild pain.   Yes [provider]  aspirin 81 MG tablet Take 81 mg by mouth daily.     Yes [provider]  furosemide (LASIX) 40 MG tablet Take 2 tablets (80 mg total) by mouth daily. 06/29/16  Yes Belva Crome, MD  latanoprost (XALATAN) 0.005 % ophthalmic solution Place 1 drop into both eyes at bedtime.    Yes [provider]  Menthol, Topical Analgesic, (BIOFREEZE EX) Apply 1 application topically See admin instructions. To the back daily as needed for arthritis   Yes [provider]  metoprolol succinate (TOPROL-XL) 50 MG 24 hr tablet Take 25 mg by mouth daily.    Yes [provider]  mineral oil-hydrophilic petrolatum (AQUAPHOR) ointment Apply 1 application topically daily as needed for dry skin. TO HEELS   Yes [provider]  Misc Natural Products (OSTEO BI-FLEX ADV JOINT SHIELD PO) Take 1 tablet by mouth daily.    Yes [provider]  Multiple Vitamins-Minerals (MACUVITE EYE CARE) TABS Take 2 tablets by mouth 2 (two) times daily.    Yes [provider]  SIMBRINZA 1-0.2 % SUSP Place 1 drop into both eyes every morning.  05/28/15  Yes [provider]  tamsulosin (FLOMAX) 0.4 MG CAPS capsule Take 0.4 mg by mouth daily. 09/08/16  Yes [provider]    Physical Exam:  Vitals:   09/24/16 1130 09/24/16 1202 09/24/16 1230 09/24/16 1306  BP: 105/67 98/62 104/76 99/71  Pulse: (!) 106 (!) 115 (!) 102 100  Resp: 16 18 20 18   Temp:      TempSrc:      SpO2: 96% 97% 97% 100%  Weight:      Height:       Constitutional: NAD, calm, comfortable, chronically ill appearing.  Eyes: PERRL, lids and conjunctivae normal ENMT: Mucous membranes are moist, without exudate or lesions  Neck: normal, supple, no masses, no thyromegaly Respiratory: chronic decreased breath sounds at the bases no wheezing, no crackles.  Normal respiratory effort  Cardiovascular: Regular rate and rhythm, 2/6  murmurs, rubs or gallops. No extremity edema. 2+ pedal pulses. No carotid bruits.  Abdomen: Soft, non tender, No hepatosplenomegaly. Bowel sounds positive.  Musculoskeletal: no clubbing / cyanosis. Moves all extremities Skin: no jaundice. He has abrasion on top of the head, as well as abrasion and skin tears in both forearms and bilateral knees. Neurologic: Sensation intact  Strength equal in all extremities Psychiatric:   Alert and oriented x 3. Normal mood.     Labs on Admission: I have personally  reviewed following labs and imaging studies  CBC:  Recent Labs Lab 09/24/16 1021  WBC 8.5  HGB 9.7*  HCT 29.8*  MCV 103.8*  PLT 027    Basic Metabolic Panel:  Recent Labs Lab 09/24/16 1021  NA 143  K 3.8  CL 106  CO2 22  GLUCOSE 131*  BUN 60*  CREATININE 3.24*  CALCIUM 8.8*    GFR: Estimated Creatinine Clearance: 12.6 mL/min (A) (by C-G formula based on SCr of 3.24 mg/dL (H)).  Liver Function Tests:  Recent Labs Lab 09/24/16 1021  AST 36  ALT 18  ALKPHOS 62  BILITOT 1.0  PROT 6.5  ALBUMIN 2.9*   No results for input(s): LIPASE, AMYLASE in the last 168 hours. No results for input(s): AMMONIA in the last 168 hours.  Coagulation Profile: No results for input(s): INR, PROTIME in the last 168 hours.  Cardiac Enzymes: No results for input(s): CKTOTAL, CKMB, CKMBINDEX, TROPONINI in the last 168 hours.  BNP (last 3 results) No results for input(s): PROBNP in the last 8760 hours.  HbA1C: No results for input(s): HGBA1C in the last 72 hours.  CBG:  Recent Labs Lab 09/24/16 0944  GLUCAP 117*    Lipid Profile: No results for input(s): CHOL, HDL, LDLCALC, TRIG, CHOLHDL, LDLDIRECT in the last 72 hours.  Thyroid Function Tests: No results for input(s): TSH, T4TOTAL, FREET4, T3FREE, THYROIDAB in the last 72 hours.  Anemia Panel: No results for input(s): VITAMINB12, FOLATE, FERRITIN,  TIBC, IRON, RETICCTPCT in the last 72 hours.  Urine analysis:    Component Value Date/Time   COLORURINE YELLOW 12/13/2014 0800   APPEARANCEUR CLEAR 12/13/2014 0800   LABSPEC 1.013 12/13/2014 0800   PHURINE 6.0 12/13/2014 0800   GLUCOSEU NEGATIVE 12/13/2014 0800   HGBUR NEGATIVE 12/13/2014 0800   BILIRUBINUR NEGATIVE 12/13/2014 0800   KETONESUR NEGATIVE 12/13/2014 0800   PROTEINUR NEGATIVE 12/13/2014 0800   UROBILINOGEN 0.2 12/13/2014 0800   NITRITE NEGATIVE 12/13/2014 0800   LEUKOCYTESUR NEGATIVE 12/13/2014 0800    Sepsis Labs: @LABRCNTIP (procalcitonin:4,lacticidven:4) )No results found for this or any previous visit (from the past 240 hour(s)).   Radiological Exams on Admission: Dg Chest 2 View  Result Date: 09/24/2016 CLINICAL DATA:  Syncope.  Fall EXAM: CHEST  2 VIEW COMPARISON:  06/06/2016 FINDINGS: Prior CABG. Elevation of the right hemidiaphragm with small to moderate right pleural effusion and right lower lobe atelectasis or infiltrate. No confluent opacity or effusion on the left. No acute bony abnormality. Heart is mildly enlarged. IMPRESSION: Elevated right hemidiaphragm with small to moderate right effusion and right lower lobe atelectasis or infiltrate. Findings similar to prior study. Electronically Signed   By: Rolm Baptise M.D.   On: 09/24/2016 11:39   Ct Head Wo Contrast  Result Date: 09/24/2016 CLINICAL DATA:  Patient fell skin abrasion all 4 extremities and top of head. EXAM: CT HEAD WITHOUT CONTRAST TECHNIQUE: Contiguous axial images were obtained from the base of the skull through the vertex without intravenous contrast. COMPARISON:  04/28/2016 FINDINGS: Brain: Diffuse parenchymal atrophy. Patchy areas of hypoattenuation in deep and periventricular white matter bilaterally. Negative for acute intracranial hemorrhage, mass lesion, acute infarction, midline shift, or mass-effect. Acute infarct may be inapparent on noncontrast CT. Ventricles and sulci symmetric.  Vascular: Atherosclerotic and physiologic intracranial calcifications. Skull: Normal. Negative for fracture or focal lesion. Sinuses/Orbits: No acute finding. Other: None. IMPRESSION: 1.  Negative for bleed or other acute intracranial process. 2. Atrophy and nonspecific white matter changes. Electronically Signed   By: Keturah Barre  Vernard Gambles M.D.   On: 09/24/2016 11:02    EKG: Independently reviewed.  Assessment/Plan Active Problems:   Syncope   HTN (hypertension)   Hyperlipidemia   CAD (coronary artery disease) of artery bypass graft   Carotid arterial disease ([facility])   CKD (chronic kidney disease), stage V ([facility])   Macular degeneration   Acute on chronic combined systolic and diastolic HF (heart failure) ([facility])   Scalp hematoma, subsequent encounter    Syncope in the setting of severe aortic valve insufficiency. He was tachycardic and hypotensive on presentation. He received 1 L of IV fluids. CT head negative  EKG does not show any new abnormalities. Troponin 0.23 CXR without changes from prior, with elevated hemidiaphragm, small to moderate effusion and right lower lobe atelectasis  Neuro exam normal. Afebrile Syncope order set  Fall precautions admit for observation - Tele bed. Gentle IVF at 81 cc/h after discussion with Cardiology EKG in am Serial Tn  Await UA result After discussion with Cardiology, patient is to continue  Beta blockers in view of terminal   Aortic valve disease, as well as Lasix, but at a decreased dose of 40 mg daily. NO Echo to be performedOf note, he is under hospice care, and DNR, and alternative care with decreased aggressive diagnostics was discussed by the EDP, however the patient is adamant that to request cardiologyinvolvement. PT/OT  Anemia of chronic disease Hemoglobin on admission 9.7  Repeat CBC in am  No transfusion is indicated at this time   Chronic kidney disease stage 5  baseline creatinine 3.2, at baseline at 3.26   Lab Results  Component Value Date    CREATININE 3.24 (H) 09/24/2016   CREATININE 3.30 (H) 04/30/2016   CREATININE 3.29 (H) 04/29/2016  IVF Repeat CMET in am    Chronic combined systolic and diastolic CHF    Last 2 D echo EF 45, severe Ao Stenosis, Gr2 DD . Weight 135 lbs  IV Lasix as discussed with Cards  continue Toprol XL as d/w Cards  monitor I/Os and daily weights prn 02  DVT prophylaxis:  Heparin   Code Status:    DNR in [facility] Family Communication:  Discussed with patient and family Disposition Plan: Expect patient to be discharged to home after condition improves Consults called:    Cardiology by EDP  Admission status:Tele  Obs   [facility] E, PA-C [facility]   09/24/2016, 1:11 PM

## 2016-09-25 DIAGNOSIS — I25701 Atherosclerosis of coronary artery bypass graft(s), unspecified, with angina pectoris with documented spasm: Secondary | ICD-10-CM

## 2016-09-25 DIAGNOSIS — N185 Chronic kidney disease, stage 5: Secondary | ICD-10-CM | POA: Diagnosis not present

## 2016-09-25 DIAGNOSIS — I25119 Atherosclerotic heart disease of native coronary artery with unspecified angina pectoris: Secondary | ICD-10-CM | POA: Diagnosis not present

## 2016-09-25 DIAGNOSIS — I10 Essential (primary) hypertension: Secondary | ICD-10-CM | POA: Diagnosis not present

## 2016-09-25 DIAGNOSIS — E785 Hyperlipidemia, unspecified: Secondary | ICD-10-CM | POA: Diagnosis not present

## 2016-09-25 DIAGNOSIS — R7989 Other specified abnormal findings of blood chemistry: Secondary | ICD-10-CM

## 2016-09-25 DIAGNOSIS — I350 Nonrheumatic aortic (valve) stenosis: Secondary | ICD-10-CM | POA: Diagnosis not present

## 2016-09-25 DIAGNOSIS — Z515 Encounter for palliative care: Secondary | ICD-10-CM | POA: Diagnosis not present

## 2016-09-25 DIAGNOSIS — R55 Syncope and collapse: Secondary | ICD-10-CM

## 2016-09-25 DIAGNOSIS — I5040 Unspecified combined systolic (congestive) and diastolic (congestive) heart failure: Secondary | ICD-10-CM | POA: Diagnosis not present

## 2016-09-25 DIAGNOSIS — Z66 Do not resuscitate: Secondary | ICD-10-CM

## 2016-09-25 DIAGNOSIS — R748 Abnormal levels of other serum enzymes: Secondary | ICD-10-CM | POA: Diagnosis not present

## 2016-09-25 DIAGNOSIS — R778 Other specified abnormalities of plasma proteins: Secondary | ICD-10-CM

## 2016-09-25 LAB — COMPREHENSIVE METABOLIC PANEL
ALT: 26 U/L (ref 17–63)
AST: 121 U/L — AB (ref 15–41)
Albumin: 2.7 g/dL — ABNORMAL LOW (ref 3.5–5.0)
Alkaline Phosphatase: 55 U/L (ref 38–126)
Anion gap: 9 (ref 5–15)
BILIRUBIN TOTAL: 0.6 mg/dL (ref 0.3–1.2)
BUN: 59 mg/dL — AB (ref 6–20)
CO2: 22 mmol/L (ref 22–32)
Calcium: 8.7 mg/dL — ABNORMAL LOW (ref 8.9–10.3)
Chloride: 108 mmol/L (ref 101–111)
Creatinine, Ser: 3.09 mg/dL — ABNORMAL HIGH (ref 0.61–1.24)
GFR calc Af Amer: 19 mL/min — ABNORMAL LOW (ref >60)
GFR, EST NON AFRICAN AMERICAN: 16 mL/min — AB (ref >60)
Glucose, Bld: 123 mg/dL — ABNORMAL HIGH (ref 65–99)
POTASSIUM: 3.8 mmol/L (ref 3.5–5.1)
Sodium: 139 mmol/L (ref 135–145)
TOTAL PROTEIN: 6.2 g/dL — AB (ref 6.5–8.1)

## 2016-09-25 LAB — GLUCOSE, CAPILLARY: GLUCOSE-CAPILLARY: 123 mg/dL — AB (ref 65–99)

## 2016-09-25 LAB — CBC
HEMATOCRIT: 27.1 % — AB (ref 39.0–52.0)
Hemoglobin: 8.9 g/dL — ABNORMAL LOW (ref 13.0–17.0)
MCH: 34.1 pg — ABNORMAL HIGH (ref 26.0–34.0)
MCHC: 32.8 g/dL (ref 30.0–36.0)
MCV: 103.8 fL — ABNORMAL HIGH (ref 78.0–100.0)
Platelets: 173 10*3/uL (ref 150–400)
RBC: 2.61 MIL/uL — ABNORMAL LOW (ref 4.22–5.81)
RDW: 16.8 % — AB (ref 11.5–15.5)
WBC: 6.1 10*3/uL (ref 4.0–10.5)

## 2016-09-25 LAB — PROTIME-INR
INR: 1.22
PROTHROMBIN TIME: 15.5 s — AB (ref 11.4–15.2)

## 2016-09-25 MED ORDER — FUROSEMIDE 40 MG PO TABS: 40.0000 mg | ORAL_TABLET | Freq: Every day | ORAL | 0 refills | Status: AC

## 2016-09-25 MED ORDER — METOPROLOL SUCCINATE ER 25 MG PO TB24: 25.0000 mg | ORAL_TABLET | Freq: Every day | ORAL | Status: DC

## 2016-09-25 MED ORDER — LORAZEPAM 1 MG PO TABS
1.0000 mg | ORAL_TABLET | Freq: Four times a day (QID) | ORAL | 0 refills | Status: AC | PRN
Start: 2016-09-25 — End: ?

## 2016-09-25 MED ORDER — MORPHINE SULFATE (CONCENTRATE) 10 MG/0.5ML PO SOLN: 5.0000 mg | ORAL | 0 refills | Status: AC | PRN

## 2016-09-25 MED ORDER — ONDANSETRON 4 MG PO TBDP: 4.0000 mg | ORAL_TABLET | Freq: Three times a day (TID) | ORAL | 0 refills | Status: AC | PRN

## 2016-09-25 MED ORDER — SODIUM CHLORIDE 0.9 % IV SOLN: INTRAVENOUS | Status: DC

## 2016-09-25 MED ORDER — MORPHINE SULFATE (CONCENTRATE) 10 MG/0.5ML PO SOLN: 5.0000 mg | ORAL | Status: DC | PRN

## 2016-09-25 MED ORDER — METOPROLOL TARTRATE 5 MG/5ML IV SOLN: 2.5000 mg | Freq: Four times a day (QID) | INTRAVENOUS | Status: DC | PRN

## 2016-09-25 MED ORDER — ONDANSETRON 4 MG PO TBDP: 4.0000 mg | ORAL_TABLET | Freq: Three times a day (TID) | ORAL | Status: DC | PRN

## 2016-09-25 MED ORDER — MORPHINE SULFATE (PF) 2 MG/ML IV SOLN
1.0000 mg | INTRAVENOUS | Status: DC | PRN
Start: 2016-09-25 — End: 2016-09-25

## 2016-09-25 MED ORDER — LORAZEPAM 1 MG PO TABS: 1.0000 mg | ORAL_TABLET | Freq: Four times a day (QID) | ORAL | Status: DC | PRN

## 2016-09-25 MED ORDER — PROMETHAZINE HCL 25 MG/ML IJ SOLN: 12.5000 mg | Freq: Four times a day (QID) | INTRAMUSCULAR | Status: DC | PRN

## 2016-09-25 MED ORDER — SODIUM CHLORIDE 0.9 % IV BOLUS (SEPSIS): 500.0000 mL | Freq: Once | INTRAVENOUS | Status: DC

## 2016-09-25 MED ORDER — ONDANSETRON HCL 4 MG/2ML IJ SOLN
INTRAMUSCULAR | Status: AC
  Filled 2016-09-25: qty 2

## 2016-09-25 MED ORDER — ONDANSETRON HCL 4 MG/2ML IJ SOLN
4.0000 mg | Freq: Four times a day (QID) | INTRAMUSCULAR | Status: DC | PRN
  Administered 2016-09-25: 4 mg via INTRAVENOUS

## 2016-09-25 NOTE — Discharge Summary (Signed)
Physician Discharge Summary  Shawn Long NFA:[id number] DOB: [birth year] DOA: 09/24/2016  PCP: [name], MD  Admit date: 09/24/2016 Discharge date: 09/25/2016   Recommendations for Outpatient Follow-Up:   1. Home with hospice- comfort focus   Discharge Diagnosis:   Active Problems:   HTN (hypertension)   Hyperlipidemia   CAD (coronary artery disease) of artery bypass graft   Carotid arterial disease (HCC)   CKD (chronic kidney disease), stage V (HCC)   Macular degeneration   Acute on chronic combined systolic and diastolic HF (heart failure) (HCC)   Syncope   Scalp hematoma, subsequent encounter   Palliative care by specialist   DNR (do not resuscitate)   Elevated troponin   Discharge disposition:  Home  Discharge Condition: terminal  Diet recommendation: Regular.  Wound care: None.   History of Present Illness:   Shawn Long is a 83 y.o. male with a Past Medical History significant for cad, ckd, aortic stenosis who presents with syncope.    Hospital Course by Problem:  Syncope -due to severe aortic valve insufficiency -had another episode here in hospital while walking back from bathroom  NSTEMI -troponin's up to 16 -seen by cardiology  -transitioning to full comfort  Anemia of chronic disease -comfort focused  Chronic kidney disease stage 5 -not a candidate for dialysis   Patient wishes to discharge home with hospice services today,  along with additional out of pocket care givers.  Family will pursue caregivers through [facility] facility.    Medical Consultants:    cards   Discharge Exam:   Vitals:   09/25/16 0920 09/25/16 1246  BP: 125/78 (!) 88/62  Pulse:  93  Resp:    Temp:     Vitals:   09/25/16 0527 09/25/16 0900 09/25/16 0920 09/25/16 1246  BP: (!) 91/52 (!) 85/52 125/78 (!) 88/62  Pulse: (!) 105   93  Resp: 18     Temp: 98.1 F (36.7 C)     TempSrc: Oral     SpO2: 96%   98%  Weight: 59.4 kg (131 lb)      Height:           The results of significant diagnostics from this hospitalization (including imaging, microbiology, ancillary and laboratory) are listed below for reference.     Procedures and Diagnostic Studies:   Dg Chest 2 View  Result Date: 09/24/2016 CLINICAL DATA:  Syncope.  Fall EXAM: CHEST  2 VIEW COMPARISON:  06/06/2016 FINDINGS: Prior CABG. Elevation of the right hemidiaphragm with small to moderate right pleural effusion and right lower lobe atelectasis or infiltrate. No confluent opacity or effusion on the left. No acute bony abnormality. Heart is mildly enlarged. IMPRESSION: Elevated right hemidiaphragm with small to moderate right effusion and right lower lobe atelectasis or infiltrate. Findings similar to prior study. Electronically Signed   By: Rolm Baptise M.D.   On: 09/24/2016 11:39   Ct Head Wo Contrast  Result Date: 09/24/2016 CLINICAL DATA:  Patient fell skin abrasion all 4 extremities and top of head. EXAM: CT HEAD WITHOUT CONTRAST TECHNIQUE: Contiguous axial images were obtained from the base of the skull through the vertex without intravenous contrast. COMPARISON:  04/28/2016 FINDINGS: Brain: Diffuse parenchymal atrophy. Patchy areas of hypoattenuation in deep and periventricular white matter bilaterally. Negative for acute intracranial hemorrhage, mass lesion, acute infarction, midline shift, or mass-effect. Acute infarct may be inapparent on noncontrast CT. Ventricles and sulci symmetric. Vascular: Atherosclerotic and physiologic intracranial calcifications. Skull: Normal. Negative for fracture  or focal lesion. Sinuses/Orbits: No acute finding. Other: None. IMPRESSION: 1.  Negative for bleed or other acute intracranial process. 2. Atrophy and nonspecific white matter changes. Electronically Signed   By: Lucrezia Europe M.D.   On: 09/24/2016 11:02     Labs:   Basic Metabolic Panel:  Recent Labs Lab 09/24/16 1021 09/25/16 0703  NA 143 139  K 3.8 3.8  CL 106 108    CO2 22 22  GLUCOSE 131* 123*  BUN 60* 59*  CREATININE 3.24* 3.09*  CALCIUM 8.8* 8.7*   GFR Estimated Creatinine Clearance: 12.8 mL/min (A) (by C-G formula based on SCr of 3.09 mg/dL (H)). Liver Function Tests:  Recent Labs Lab 09/24/16 1021 09/25/16 0703  AST 36 121*  ALT 18 26  ALKPHOS 62 55  BILITOT 1.0 0.6  PROT 6.5 6.2*  ALBUMIN 2.9* 2.7*   No results for input(s): LIPASE, AMYLASE in the last 168 hours. No results for input(s): AMMONIA in the last 168 hours. Coagulation profile  Recent Labs Lab 09/25/16 0703  INR 1.22    CBC:  Recent Labs Lab 09/24/16 1021 09/25/16 0703  WBC 8.5 6.1  HGB 9.7* 8.9*  HCT 29.8* 27.1*  MCV 103.8* 103.8*  PLT 173 173   Cardiac Enzymes:  Recent Labs Lab 09/24/16 1436 09/24/16 1746 09/24/16 2039  TROPONINI 2.15* 11.22* 16.62*   BNP: Invalid input(s): POCBNP CBG:  Recent Labs Lab 09/24/16 0944 09/25/16 0702  GLUCAP 117* 123*   D-Dimer No results for input(s): DDIMER in the last 72 hours. Hgb A1c No results for input(s): HGBA1C in the last 72 hours. Lipid Profile No results for input(s): CHOL, HDL, LDLCALC, TRIG, CHOLHDL, LDLDIRECT in the last 72 hours. Thyroid function studies No results for input(s): TSH, T4TOTAL, T3FREE, THYROIDAB in the last 72 hours.  Invalid input(s): FREET3 Anemia work up No results for input(s): VITAMINB12, FOLATE, FERRITIN, TIBC, IRON, RETICCTPCT in the last 72 hours. Microbiology No results found for this or any previous visit (from the past 240 hour(s)).   Discharge Instructions:   Discharge Instructions    Discharge instructions    Complete by:  As directed    Home with hospice Anytime up right/moving may have syncopal event   Increase activity slowly    Complete by:  As directed      Allergies as of 09/25/2016      Reactions   Penicillins Rash   Has patient had a PCN reaction causing immediate rash, facial/tongue/throat swelling, SOB or lightheadedness with  hypotension: Yes Has patient had a PCN reaction causing severe rash involving mucus membranes or skin necrosis: No Has patient had a PCN reaction that required hospitalization: No Has patient had a PCN reaction occurring within the last 10 years: No If all of the above answers are "NO", then may proceed with Cephalosporin use.      Medication List    STOP taking these medications   aspirin 81 MG tablet     TAKE these medications   acetaminophen 325 MG tablet Commonly known as:  TYLENOL Take 325 mg by mouth 2 (two) times daily as needed for mild pain.   BIOFREEZE EX Apply 1 application topically See admin instructions. To the back daily as needed for arthritis   furosemide 40 MG tablet Commonly known as:  LASIX Take 1 tablet (40 mg total) by mouth daily. What changed:  how much to take   latanoprost 0.005 % ophthalmic solution Commonly known as:  XALATAN Place 1 drop into both  eyes at bedtime.   LORazepam 1 MG tablet Commonly known as:  ATIVAN Take 1 tablet (1 mg total) by mouth every 6 (six) hours as needed for anxiety.   MACUVITE EYE CARE Tabs Take 2 tablets by mouth 2 (two) times daily.   metoprolol succinate 50 MG 24 hr tablet Commonly known as:  TOPROL-XL Take 25 mg by mouth daily.   mineral oil-hydrophilic petrolatum ointment Apply 1 application topically daily as needed for dry skin. TO HEELS   morphine CONCENTRATE 10 MG/0.5ML Soln concentrated solution Take 0.25 mLs (5 mg total) by mouth every hour as needed for moderate pain or shortness of breath.   ondansetron 4 MG disintegrating tablet Commonly known as:  ZOFRAN-ODT Take 1 tablet (4 mg total) by mouth every 8 (eight) hours as needed for nausea or vomiting.   OSTEO BI-FLEX ADV JOINT SHIELD PO Take 1 tablet by mouth daily.   SIMBRINZA 1-0.2 % Susp Generic drug:  Brinzolamide-Brimonidine Place 1 drop into both eyes every morning.   tamsulosin 0.4 MG Caps capsule Commonly known as:  FLOMAX Take 0.4  mg by mouth daily.      Follow-up Information    [name], MD. Go on 10/05/2016.   Specialty:  Family Medicine Why:  @12  Noon Contact information: [address] [place] [address] [phone]            Time coordinating discharge: 35 min  Signed:  Geradine Girt   [facility] 09/25/2016, 5:41 PM

## 2016-09-25 NOTE — Progress Notes (Signed)
OT Cancellation   09/25/16 1000  OT Visit Information  Last OT Received On 09/25/16  Reason Eval/Treat Not Completed Other (comment) (Pt with elevated troponin and has been transitioned to full comfort care. Will dc from acute OT per reports from RN.)   Loretha Brasil MSOT, OTR/L Acute Rehab Pager: [phone] Office: [phone]

## 2016-09-25 NOTE — Progress Notes (Signed)
Patient and family given discharge instructions and all questions answered.  Patient discharged via wheelchair with all belongings. 

## 2016-09-25 NOTE — Consult Note (Signed)
Actually started this                                                                                   Consultation Note Date: 09/25/2016   Patient Name: Shawn Long  DOB: 04/15/1922  MRN: [id number]  Age / Sex: 83 y.o., male  PCP: [name], MD Referring Physician: Geradine Girt, DO  Reason for Consultation: Establishing goals of care and Psychosocial/spiritual support  HPI/Patient Profile: 83 y.o. male  admitted on 09/24/2016  2/2 to increased weakness, dizziness and syncope.  He is under [facility] service.   PMH of  critical aortic stenosis, not a candidate for repair, CAD, stage V kidney disease, chronic anemia.   Admitted for evaluation and treatment.  Troponin level found to be up to 16/ NSTEMI.   Patient is clear that his wishes are comfort and dignity.  Patient and family face decisions on care plan moving forward and anticipatory care needs.     Clinical Assessment and Goals of Care:  This NP Wadie Lessen reviewed medical records, received report from team, assessed the patient and then meet at the patient's bedside along with his wife and son/David  to discuss diagnosis, prognosis, GOC, EOL wishes disposition and options.  A detailed discussion was had today regarding advanced directives.  Concepts specific to code status, artifical feeding and hydration, continued IV antibiotics and rehospitalization was had.  The difference between a aggressive medical intervention path  and a palliative comfort care path for this patient at this time was had.  Values and goals of care important to patient and family were attempted to be elicited.  MOST form completed  Concept of Hospice and Palliative Care were discussed  Natural trajectory and expectations at EOL were discussed.  Questions and concerns addressed.   Family encouraged to call with questions or concerns.  PMT will continue to support holistically.   SUMMARY OF RECOMMENDATIONS    Patient wishes to  discharge home with hospice services today,  along with additional out of pocket care givers.  Family will pursue caregivers through [facility] facility.  They will need non-emergent transport home.  Code Status/Advance Care Planning:  DNR   Symptom Management:   Pain/Dyspnea: Roxanol 5 mg po/sl every 1 hr prn  Anxiety: Ativan 1 mg PO/SL every 6 hrs prn  Palliative Prophylaxis:   Bowel Regimen, Frequent Pain Assessment and Oral Care  Additional Recommendations (Limitations, Scope, Preferences):  Full Comfort Care- MOST form completed, original with family  Psycho-social/Spiritual:   Mr Pohle was very clear on his EOL wishes of comfort and dignity. He pleasantly shared his life review of love of his family and wife of 91 years.  He retired from [employer] and he and his wife traveled the world in retirement.   Desire for further Chaplaincy support:no-strong community church support.  Additional Recommendations: Education on Hospice  Prognosis:   < 4 weeks   Discharge Planning: I placed call to [facility] liaison and stressed importance of early visit once discharged home.  Patient wishes to discharge home today.   Dr Eliseo Squires aware.   Home with Hospice     Primary Diagnoses: Present on Admission: .  Syncope . HTN (hypertension) . Hyperlipidemia . CAD (coronary artery disease) of artery bypass graft . Carotid arterial disease ([place]) . CKD (chronic kidney disease), stage V ([place]) . Macular degeneration . Acute on chronic combined systolic and diastolic HF (heart failure) ([place])   I have reviewed the medical record, interviewed the patient and family, and examined the patient. The following aspects are pertinent.  Past Medical History:  Diagnosis Date  . Aortic valve disorders   . CAD (coronary artery disease) 1998   a. s/p cabg x4 in 1998.  . Carotid arterial disease ([place])    a. prior CEA 1999.  Marland Kitchen Chronic anemia   . Chronic combined systolic and diastolic CHF (congestive  heart failure) ([place])    a. EF 45% by echo 2017.  Marland Kitchen Chronic kidney disease (CKD), stage IV (severe) ([place])   . Heart murmur   . HTN (hypertension)   . Hyperlipidemia   . Macular degeneration   . Severe aortic stenosis    Social History   Social History  . Marital status: Married    Spouse name: N/A  . Number of children: 3  . Years of education: N/A   Occupational History  . unemployed, retired     Social History Main Topics  . Smoking status: Never Smoker  . Smokeless tobacco: Never Used  . Alcohol use Yes     Comment: 4oz wine per day  . Drug use: No  . Sexual activity: Not Asked   Other Topics Concern  . None   Social History Narrative  . None   Family History  Problem Relation Age of Onset  . Cancer - Ovarian Mother    Scheduled Meds: . aspirin EC  81 mg Oral Daily  . furosemide  40 mg Oral Daily  . heparin  5,000 Units Subcutaneous Q8H  . latanoprost  1 drop Both Eyes QHS  . metoprolol succinate  25 mg Oral Daily  . sodium chloride flush  3 mL Intravenous Q12H  . tamsulosin  0.4 mg Oral Daily   Continuous Infusions: . sodium chloride 50 mL/hr at 09/25/16 0941   PRN Meds:.acetaminophen **OR** acetaminophen, HYDROcodone-acetaminophen, metoprolol tartrate, morphine injection, ondansetron (ZOFRAN) IV, promethazine, senna-docusate Medications Prior to Admission:  Prior to Admission medications   Medication Sig Start Date End Date Taking? Authorizing Provider  acetaminophen (TYLENOL) 325 MG tablet Take 325 mg by mouth 2 (two) times daily as needed for mild pain.   Yes [provider]  aspirin 81 MG tablet Take 81 mg by mouth daily.     Yes [provider]  furosemide (LASIX) 40 MG tablet Take 2 tablets (80 mg total) by mouth daily. 06/29/16  Yes Belva Crome, MD  latanoprost (XALATAN) 0.005 % ophthalmic solution Place 1 drop into both eyes at bedtime.    Yes [provider]  Menthol, Topical Analgesic, (BIOFREEZE EX) Apply 1  application topically See admin instructions. To the back daily as needed for arthritis   Yes [provider]  metoprolol succinate (TOPROL-XL) 50 MG 24 hr tablet Take 25 mg by mouth daily.    Yes [provider]  mineral oil-hydrophilic petrolatum (AQUAPHOR) ointment Apply 1 application topically daily as needed for dry skin. TO HEELS   Yes [provider]  Misc Natural Products (OSTEO BI-FLEX ADV JOINT SHIELD PO) Take 1 tablet by mouth daily.    Yes [provider]  Multiple Vitamins-Minerals (MACUVITE EYE CARE) TABS Take 2 tablets by mouth 2 (two) times daily.  Yes [provider]  SIMBRINZA 1-0.2 % SUSP Place 1 drop into both eyes every morning.  05/28/15  Yes [provider]  tamsulosin (FLOMAX) 0.4 MG CAPS capsule Take 0.4 mg by mouth daily. 09/08/16  Yes [provider]   Allergies  Allergen Reactions  . Penicillins Rash    Has patient had a PCN reaction causing immediate rash, facial/tongue/throat swelling, SOB or lightheadedness with hypotension: Yes Has patient had a PCN reaction causing severe rash involving mucus membranes or skin necrosis: No Has patient had a PCN reaction that required hospitalization: No Has patient had a PCN reaction occurring within the last 10 years: No If all of the above answers are "NO", then may proceed with Cephalosporin use.   Review of Systems  Constitutional: Positive for activity change and fatigue.  Respiratory: Positive for shortness of breath.   Gastrointestinal: Positive for nausea.  Neurological: Positive for weakness.    Physical Exam  Constitutional: He appears well-developed. He appears ill. Nasal cannula in place.  Cardiovascular: Tachycardia present.   Neurological: He is alert.  Skin: Skin is warm and dry.    Vital Signs: BP 125/78 (BP Location: Right Arm)   Pulse (!) 105   Temp 98.1 F (36.7 C) (Oral)   Resp 18   Ht 5\' 6"  (1.676 m)   Wt 59.4 kg (131 lb)   SpO2  96%   BMI 21.14 kg/m  Pain Assessment: No/denies pain       SpO2: SpO2: 96 % O2 Device:SpO2: 96 % O2 Flow Rate: .   IO: Intake/output summary:  Intake/Output Summary (Last 24 hours) at 09/25/16 1206 Last data filed at 09/25/16 3354  Gross per 24 hour  Intake             1135 ml  Output              550 ml  Net              585 ml    LBM: Last BM Date: 09/23/16 Baseline Weight: Weight: 61.2 kg (135 lb) Most recent weight: Weight: 59.4 kg (131 lb)     Palliative Assessment/Data: 30 % at best   Discussed with Dr Eliseo Squires  Time In: 1115 Time Out: 1230 Time Total: 75 min Greater than 50%  of this time was spent counseling and coordinating care related to the above assessment and plan.  Signed by: Wadie Lessen, NP   Please contact Palliative Medicine Team phone at [phone] for questions and concerns.  For individual provider: See Shea Evans

## 2016-09-25 NOTE — Progress Notes (Addendum)
Received consult - From home with hospice  Patient is followed by Hospice and [facility]; they will resume services at discharge; Aneta Mins [phone]

## 2016-09-25 NOTE — Progress Notes (Signed)
PROGRESS NOTE    Shawn Long  XTG:[id number] DOB: 1922-05-03 DOA: 09/24/2016 PCP: [name], MD   Outpatient Specialists:  hospice   Brief Narrative:  Shawn Long is a 83 y.o. male with a Past Medical History significant for cad, ckd, aortic stenosis who presents with syncope.   Assessment & Plan:   Active Problems:   HTN (hypertension)   Hyperlipidemia   CAD (coronary artery disease) of artery bypass graft   Carotid arterial disease (HCC)   CKD (chronic kidney disease), stage V (HCC)   Macular degeneration   Acute on chronic combined systolic and diastolic HF (heart failure) (HCC)   Syncope   Scalp hematoma, subsequent encounter   Syncope  -due to severe aortic valve insufficiency -had another episode here in hospital while walking back from bathroom  NSTEMI -troponin's up to 16 -seen by cardiology  -transitioning to full comfort  Anemia of chronic disease -comfort focused  Chronic kidney disease stage 5  -not a candidate for dialysis  Patient was previously on hospice at home. Now patient more of a comfort focus-- see at bedside with palliative care-- from IL[facility]   DVT prophylaxis:  SQ Heparin  Code Status: DNR   Family Communication: Palliative care spoke with family  Disposition Plan:     Consultants:   Palliative care     Subjective: Seen early this AM before syncopal event-- no complaints- ate a good breakfast- after event c/o nausea  Objective: Vitals:   09/25/16 0100 09/25/16 0527 09/25/16 0900 09/25/16 0920  BP: 94/62 (!) 91/52 (!) 85/52 125/78  Pulse: (!) 101 (!) 105    Resp: 18 18    Temp: 98.2 F (36.8 C) 98.1 F (36.7 C)    TempSrc: Oral Oral    SpO2: 94% 96%    Weight:  59.4 kg (131 lb)    Height:        Intake/Output Summary (Last 24 hours) at 09/25/16 1154 Last data filed at 09/25/16 2703  Gross per 24 hour  Intake             1135 ml  Output              550 ml  Net              585 ml   Filed  Weights   09/24/16 0949 09/24/16 1630 09/25/16 0527  Weight: 61.2 kg (135 lb) 61.4 kg (135 lb 4.8 oz) 59.4 kg (131 lb)    Examination:  General exam: chronically ill apeparing Respiratory system: no wheezing, no crackles Cardiovascular system: tachy Gastrointestinal system: +BS, soft Central nervous system: Alert No focal neurological deficits. Extremities: moves all 4 exy Skin: No rashes, lesions or ulcers Psychiatry: Judgement and insight appear normal. Mood & affect appropriate before event    Data Reviewed: I have personally reviewed following labs and imaging studies  CBC:  Recent Labs Lab 09/24/16 1021 09/25/16 0703  WBC 8.5 6.1  HGB 9.7* 8.9*  HCT 29.8* 27.1*  MCV 103.8* 103.8*  PLT 173 500   Basic Metabolic Panel:  Recent Labs Lab 09/24/16 1021 09/25/16 0703  NA 143 139  K 3.8 3.8  CL 106 108  CO2 22 22  GLUCOSE 131* 123*  BUN 60* 59*  CREATININE 3.24* 3.09*  CALCIUM 8.8* 8.7*   GFR: Estimated Creatinine Clearance: 12.8 mL/min (A) (by C-G formula based on SCr of 3.09 mg/dL (H)). Liver Function Tests:  Recent Labs Lab 09/24/16 1021 09/25/16 0703  AST  36 121*  ALT 18 26  ALKPHOS 62 55  BILITOT 1.0 0.6  PROT 6.5 6.2*  ALBUMIN 2.9* 2.7*   No results for input(s): LIPASE, AMYLASE in the last 168 hours. No results for input(s): AMMONIA in the last 168 hours. Coagulation Profile:  Recent Labs Lab 09/25/16 0703  INR 1.22   Cardiac Enzymes:  Recent Labs Lab 09/24/16 1436 09/24/16 1746 09/24/16 2039  TROPONINI 2.15* 11.22* 16.62*   BNP (last 3 results) No results for input(s): PROBNP in the last 8760 hours. HbA1C: No results for input(s): HGBA1C in the last 72 hours. CBG:  Recent Labs Lab 09/24/16 0944 09/25/16 0702  GLUCAP 117* 123*   Lipid Profile: No results for input(s): CHOL, HDL, LDLCALC, TRIG, CHOLHDL, LDLDIRECT in the last 72 hours. Thyroid Function Tests: No results for input(s): TSH, T4TOTAL, FREET4, T3FREE,  THYROIDAB in the last 72 hours. Anemia Panel: No results for input(s): VITAMINB12, FOLATE, FERRITIN, TIBC, IRON, RETICCTPCT in the last 72 hours. Urine analysis:    Component Value Date/Time   COLORURINE YELLOW 09/24/2016 1714   APPEARANCEUR CLEAR 09/24/2016 1714   LABSPEC 1.011 09/24/2016 1714   PHURINE 5.0 09/24/2016 1714   GLUCOSEU NEGATIVE 09/24/2016 1714   HGBUR NEGATIVE 09/24/2016 1714   BILIRUBINUR NEGATIVE 09/24/2016 1714   KETONESUR NEGATIVE 09/24/2016 1714   PROTEINUR NEGATIVE 09/24/2016 1714   UROBILINOGEN 0.2 12/13/2014 0800   NITRITE NEGATIVE 09/24/2016 1714   LEUKOCYTESUR NEGATIVE 09/24/2016 1714     )No results found for this or any previous visit (from the past 240 hour(s)).    Anti-infectives    None       Radiology Studies: Dg Chest 2 View  Result Date: 09/24/2016 CLINICAL DATA:  Syncope.  Fall EXAM: CHEST  2 VIEW COMPARISON:  06/06/2016 FINDINGS: Prior CABG. Elevation of the right hemidiaphragm with small to moderate right pleural effusion and right lower lobe atelectasis or infiltrate. No confluent opacity or effusion on the left. No acute bony abnormality. Heart is mildly enlarged. IMPRESSION: Elevated right hemidiaphragm with small to moderate right effusion and right lower lobe atelectasis or infiltrate. Findings similar to prior study. Electronically Signed   By: Rolm Baptise M.D.   On: 09/24/2016 11:39   Ct Head Wo Contrast  Result Date: 09/24/2016 CLINICAL DATA:  Patient fell skin abrasion all 4 extremities and top of head. EXAM: CT HEAD WITHOUT CONTRAST TECHNIQUE: Contiguous axial images were obtained from the base of the skull through the vertex without intravenous contrast. COMPARISON:  04/28/2016 FINDINGS: Brain: Diffuse parenchymal atrophy. Patchy areas of hypoattenuation in deep and periventricular white matter bilaterally. Negative for acute intracranial hemorrhage, mass lesion, acute infarction, midline shift, or mass-effect. Acute infarct may be  inapparent on noncontrast CT. Ventricles and sulci symmetric. Vascular: Atherosclerotic and physiologic intracranial calcifications. Skull: Normal. Negative for fracture or focal lesion. Sinuses/Orbits: No acute finding. Other: None. IMPRESSION: 1.  Negative for bleed or other acute intracranial process. 2. Atrophy and nonspecific white matter changes. Electronically Signed   By: Lucrezia Europe M.D.   On: 09/24/2016 11:02        Scheduled Meds: . aspirin EC  81 mg Oral Daily  . furosemide  40 mg Oral Daily  . heparin  5,000 Units Subcutaneous Q8H  . latanoprost  1 drop Both Eyes QHS  . metoprolol succinate  25 mg Oral Daily  . sodium chloride flush  3 mL Intravenous Q12H  . tamsulosin  0.4 mg Oral Daily   Continuous Infusions: . sodium chloride 50  mL/hr at 09/25/16 0941     LOS: 0 days    Time spent: 23 min    [place], DO [facility] Pager [phone]  If 7PM-7AM, please contact night-coverage [email or web address] Password [email or web address] 09/25/2016, 11:54 AM

## 2016-09-25 NOTE — Progress Notes (Signed)
Troponin 16.62, no s/s,was 11.22 earlier, MD notified will continue to monitor, Thanks Arvella Nigh RN.

## 2016-09-25 NOTE — Progress Notes (Addendum)
[facility] RN Visit.   This is a related and covered GIP admission on 09/24/16 with [facility] diagnosis of non rheumatic aortic stenosis per Dr. [name]. Patient experienced a fall after a syncopal episode at home. EMS was called and patient was transported to [facility] ED. Hospice was not notified.  Patient is alert and oriented. No complaints of pain. Currently experiencing some nausea and vomiting which is being treated.  PIV left AC.   IV Fluids:  NS@50ml /hr.  Medications: Tylenol 650mg  PO or suppository prn pain, Vicodin 5/325mg  prn pain, Lopressor 2.5mg  IV Q6hrs prn HR, Morphine 1mg  Qhr prn pain, Zofran 4mg  Q6hrs prn nausea, Phenergan 12.5mg  PO Q6hrs prn nausea, Senekot 1 tablet PO HS prn.   Dr. Pearson Grippe and Dr. [name] notified of admission.   Medication list and transfer summery placed on shadow chart.   Will continue to follow while hospitalized and anticipate any discharge needs.  Thank you  [facility]ia[facility]  [phone]    All Hospital Liaisons are on AMION

## 2016-09-25 NOTE — Progress Notes (Signed)
RN and NT layed patient flat to pull him up in bed.  Patient became unresponsive, pale, and diaphoretic, breathing agonally.  BP obtained, 85/52.  Dr. Eliseo Squires called and fluid bolus started.  Patient blood pressure up to 125/78 after 10 minutes. Patient was in and out of being alert.  When Dr. Eliseo Squires arrived to room, patient became alert and oriented again. Patient complaining of nausea and zofran was ordered and given.  Patient's family called and palliative team at bedside. Will continue to monitor patient.

## 2016-09-26 DIAGNOSIS — I10 Essential (primary) hypertension: Secondary | ICD-10-CM | POA: Diagnosis not present

## 2016-09-26 DIAGNOSIS — I25119 Atherosclerotic heart disease of native coronary artery with unspecified angina pectoris: Secondary | ICD-10-CM | POA: Diagnosis not present

## 2016-09-26 DIAGNOSIS — I5040 Unspecified combined systolic (congestive) and diastolic (congestive) heart failure: Secondary | ICD-10-CM | POA: Diagnosis not present

## 2016-09-26 DIAGNOSIS — E785 Hyperlipidemia, unspecified: Secondary | ICD-10-CM | POA: Diagnosis not present

## 2016-09-26 DIAGNOSIS — I350 Nonrheumatic aortic (valve) stenosis: Secondary | ICD-10-CM | POA: Diagnosis not present

## 2016-09-26 DIAGNOSIS — N185 Chronic kidney disease, stage 5: Secondary | ICD-10-CM | POA: Diagnosis not present

## 2016-09-27 DIAGNOSIS — I6523 Occlusion and stenosis of bilateral carotid arteries: Secondary | ICD-10-CM | POA: Diagnosis not present

## 2016-09-27 DIAGNOSIS — R630 Anorexia: Secondary | ICD-10-CM | POA: Diagnosis not present

## 2016-09-27 DIAGNOSIS — I10 Essential (primary) hypertension: Secondary | ICD-10-CM | POA: Diagnosis not present

## 2016-09-27 DIAGNOSIS — M1990 Unspecified osteoarthritis, unspecified site: Secondary | ICD-10-CM | POA: Diagnosis not present

## 2016-09-27 DIAGNOSIS — I25119 Atherosclerotic heart disease of native coronary artery with unspecified angina pectoris: Secondary | ICD-10-CM | POA: Diagnosis not present

## 2016-09-27 DIAGNOSIS — D631 Anemia in chronic kidney disease: Secondary | ICD-10-CM | POA: Diagnosis not present

## 2016-09-27 DIAGNOSIS — N185 Chronic kidney disease, stage 5: Secondary | ICD-10-CM | POA: Diagnosis not present

## 2016-09-27 DIAGNOSIS — H3530 Unspecified macular degeneration: Secondary | ICD-10-CM | POA: Diagnosis not present

## 2016-09-27 DIAGNOSIS — I5040 Unspecified combined systolic (congestive) and diastolic (congestive) heart failure: Secondary | ICD-10-CM | POA: Diagnosis not present

## 2016-09-27 DIAGNOSIS — E785 Hyperlipidemia, unspecified: Secondary | ICD-10-CM | POA: Diagnosis not present

## 2016-09-27 DIAGNOSIS — I350 Nonrheumatic aortic (valve) stenosis: Secondary | ICD-10-CM | POA: Diagnosis not present

## 2016-09-27 DIAGNOSIS — R634 Abnormal weight loss: Secondary | ICD-10-CM | POA: Diagnosis not present

## 2016-09-27 DIAGNOSIS — R0600 Dyspnea, unspecified: Secondary | ICD-10-CM | POA: Diagnosis not present

## 2016-09-28 DIAGNOSIS — N185 Chronic kidney disease, stage 5: Secondary | ICD-10-CM | POA: Diagnosis not present

## 2016-09-28 DIAGNOSIS — I25119 Atherosclerotic heart disease of native coronary artery with unspecified angina pectoris: Secondary | ICD-10-CM | POA: Diagnosis not present

## 2016-09-28 DIAGNOSIS — E785 Hyperlipidemia, unspecified: Secondary | ICD-10-CM | POA: Diagnosis not present

## 2016-09-28 DIAGNOSIS — I10 Essential (primary) hypertension: Secondary | ICD-10-CM | POA: Diagnosis not present

## 2016-09-28 DIAGNOSIS — I350 Nonrheumatic aortic (valve) stenosis: Secondary | ICD-10-CM | POA: Diagnosis not present

## 2016-09-28 DIAGNOSIS — I5040 Unspecified combined systolic (congestive) and diastolic (congestive) heart failure: Secondary | ICD-10-CM | POA: Diagnosis not present

## 2016-09-29 DIAGNOSIS — N185 Chronic kidney disease, stage 5: Secondary | ICD-10-CM | POA: Diagnosis not present

## 2016-09-29 DIAGNOSIS — I25119 Atherosclerotic heart disease of native coronary artery with unspecified angina pectoris: Secondary | ICD-10-CM | POA: Diagnosis not present

## 2016-09-29 DIAGNOSIS — I350 Nonrheumatic aortic (valve) stenosis: Secondary | ICD-10-CM | POA: Diagnosis not present

## 2016-09-29 DIAGNOSIS — I5040 Unspecified combined systolic (congestive) and diastolic (congestive) heart failure: Secondary | ICD-10-CM | POA: Diagnosis not present

## 2016-09-29 DIAGNOSIS — I10 Essential (primary) hypertension: Secondary | ICD-10-CM | POA: Diagnosis not present

## 2016-09-29 DIAGNOSIS — E785 Hyperlipidemia, unspecified: Secondary | ICD-10-CM | POA: Diagnosis not present

## 2016-10-02 DIAGNOSIS — I25119 Atherosclerotic heart disease of native coronary artery with unspecified angina pectoris: Secondary | ICD-10-CM | POA: Diagnosis not present

## 2016-10-02 DIAGNOSIS — I10 Essential (primary) hypertension: Secondary | ICD-10-CM | POA: Diagnosis not present

## 2016-10-02 DIAGNOSIS — N185 Chronic kidney disease, stage 5: Secondary | ICD-10-CM | POA: Diagnosis not present

## 2016-10-02 DIAGNOSIS — E785 Hyperlipidemia, unspecified: Secondary | ICD-10-CM | POA: Diagnosis not present

## 2016-10-02 DIAGNOSIS — I5040 Unspecified combined systolic (congestive) and diastolic (congestive) heart failure: Secondary | ICD-10-CM | POA: Diagnosis not present

## 2016-10-02 DIAGNOSIS — I350 Nonrheumatic aortic (valve) stenosis: Secondary | ICD-10-CM | POA: Diagnosis not present

## 2016-10-05 DIAGNOSIS — I10 Essential (primary) hypertension: Secondary | ICD-10-CM | POA: Diagnosis not present

## 2016-10-05 DIAGNOSIS — I350 Nonrheumatic aortic (valve) stenosis: Secondary | ICD-10-CM | POA: Diagnosis not present

## 2016-10-05 DIAGNOSIS — I25119 Atherosclerotic heart disease of native coronary artery with unspecified angina pectoris: Secondary | ICD-10-CM | POA: Diagnosis not present

## 2016-10-05 DIAGNOSIS — I5040 Unspecified combined systolic (congestive) and diastolic (congestive) heart failure: Secondary | ICD-10-CM | POA: Diagnosis not present

## 2016-10-05 DIAGNOSIS — N185 Chronic kidney disease, stage 5: Secondary | ICD-10-CM | POA: Diagnosis not present

## 2016-10-05 DIAGNOSIS — E785 Hyperlipidemia, unspecified: Secondary | ICD-10-CM | POA: Diagnosis not present

## 2016-10-06 DIAGNOSIS — I350 Nonrheumatic aortic (valve) stenosis: Secondary | ICD-10-CM | POA: Diagnosis not present

## 2016-10-09 DIAGNOSIS — I10 Essential (primary) hypertension: Secondary | ICD-10-CM | POA: Diagnosis not present

## 2016-10-09 DIAGNOSIS — I350 Nonrheumatic aortic (valve) stenosis: Secondary | ICD-10-CM | POA: Diagnosis not present

## 2016-10-09 DIAGNOSIS — I5040 Unspecified combined systolic (congestive) and diastolic (congestive) heart failure: Secondary | ICD-10-CM | POA: Diagnosis not present

## 2016-10-09 DIAGNOSIS — N185 Chronic kidney disease, stage 5: Secondary | ICD-10-CM | POA: Diagnosis not present

## 2016-10-09 DIAGNOSIS — E785 Hyperlipidemia, unspecified: Secondary | ICD-10-CM | POA: Diagnosis not present

## 2016-10-09 DIAGNOSIS — I25119 Atherosclerotic heart disease of native coronary artery with unspecified angina pectoris: Secondary | ICD-10-CM | POA: Diagnosis not present

## 2016-10-12 DIAGNOSIS — I10 Essential (primary) hypertension: Secondary | ICD-10-CM | POA: Diagnosis not present

## 2016-10-12 DIAGNOSIS — E785 Hyperlipidemia, unspecified: Secondary | ICD-10-CM | POA: Diagnosis not present

## 2016-10-12 DIAGNOSIS — I5040 Unspecified combined systolic (congestive) and diastolic (congestive) heart failure: Secondary | ICD-10-CM | POA: Diagnosis not present

## 2016-10-12 DIAGNOSIS — N185 Chronic kidney disease, stage 5: Secondary | ICD-10-CM | POA: Diagnosis not present

## 2016-10-12 DIAGNOSIS — I350 Nonrheumatic aortic (valve) stenosis: Secondary | ICD-10-CM | POA: Diagnosis not present

## 2016-10-12 DIAGNOSIS — I25119 Atherosclerotic heart disease of native coronary artery with unspecified angina pectoris: Secondary | ICD-10-CM | POA: Diagnosis not present

## 2016-10-13 DIAGNOSIS — I350 Nonrheumatic aortic (valve) stenosis: Secondary | ICD-10-CM | POA: Diagnosis not present

## 2016-10-13 DIAGNOSIS — E785 Hyperlipidemia, unspecified: Secondary | ICD-10-CM | POA: Diagnosis not present

## 2016-10-13 DIAGNOSIS — I5040 Unspecified combined systolic (congestive) and diastolic (congestive) heart failure: Secondary | ICD-10-CM | POA: Diagnosis not present

## 2016-10-13 DIAGNOSIS — I25119 Atherosclerotic heart disease of native coronary artery with unspecified angina pectoris: Secondary | ICD-10-CM | POA: Diagnosis not present

## 2016-10-13 DIAGNOSIS — N185 Chronic kidney disease, stage 5: Secondary | ICD-10-CM | POA: Diagnosis not present

## 2016-10-13 DIAGNOSIS — I10 Essential (primary) hypertension: Secondary | ICD-10-CM | POA: Diagnosis not present

## 2016-10-16 ENCOUNTER — Telehealth: Payer: Self-pay | Admitting: Interventional Cardiology

## 2016-10-16 NOTE — Telephone Encounter (Signed)
Will route to Dr. Smith to make him aware. 

## 2016-10-16 NOTE — Telephone Encounter (Signed)
New Message:   Son called, he wanted Dr [name] to know the pt passed away on 2016-10-24.

## 2016-10-28 DEATH — deceased

## 2017-09-05 DIAGNOSIS — Z515 Encounter for palliative care: Secondary | ICD-10-CM

## 2017-09-05 DIAGNOSIS — Z7189 Other specified counseling: Secondary | ICD-10-CM

## 2018-01-11 IMAGING — DX DG CHEST 2V
2 series · 2 of 2 positions shown · non-contrast
Comparison: 01/07/2016 chest radiograph.

CLINICAL DATA: Shortness of breath.

EXAM:
CHEST  2 VIEW

[chest pa]
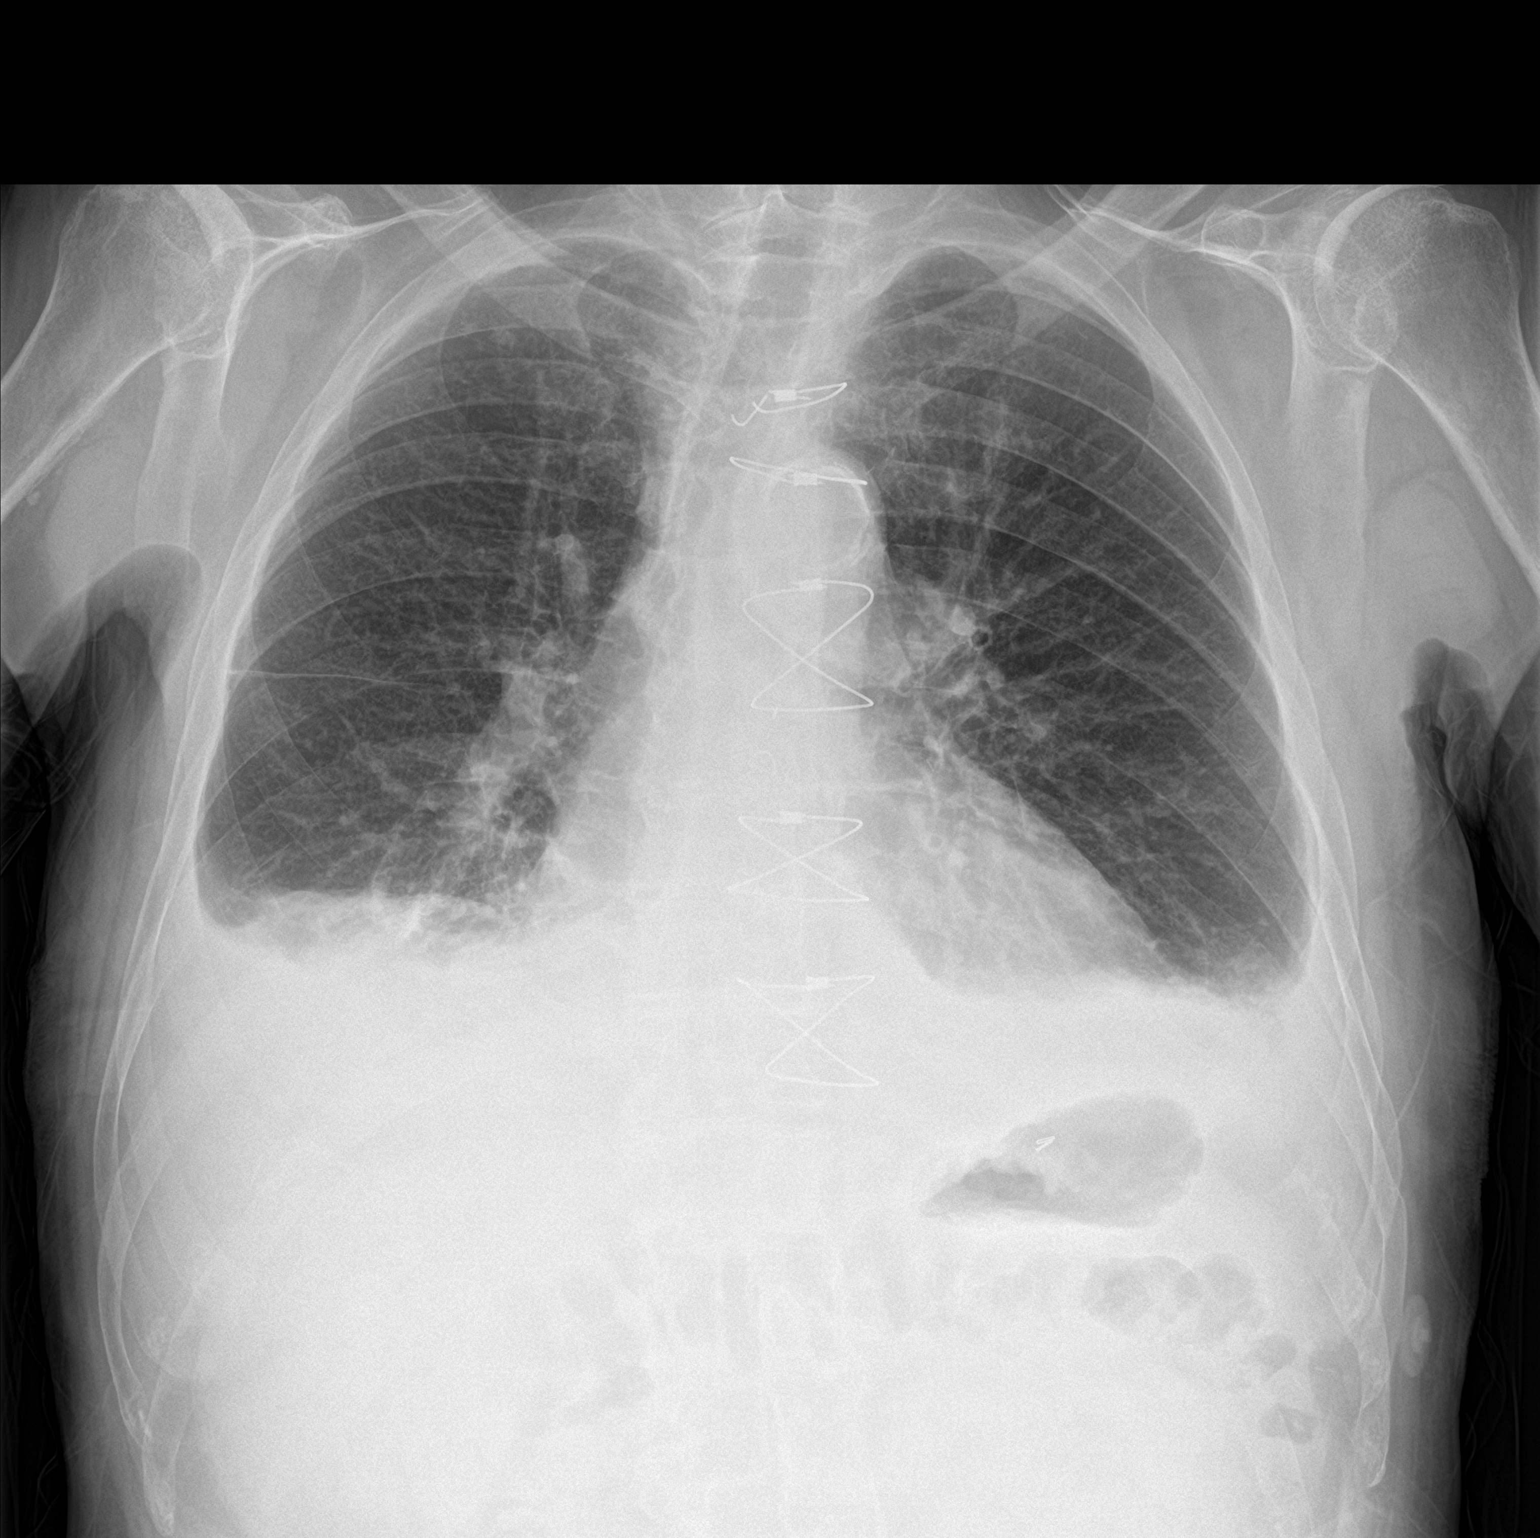

[chest lat]
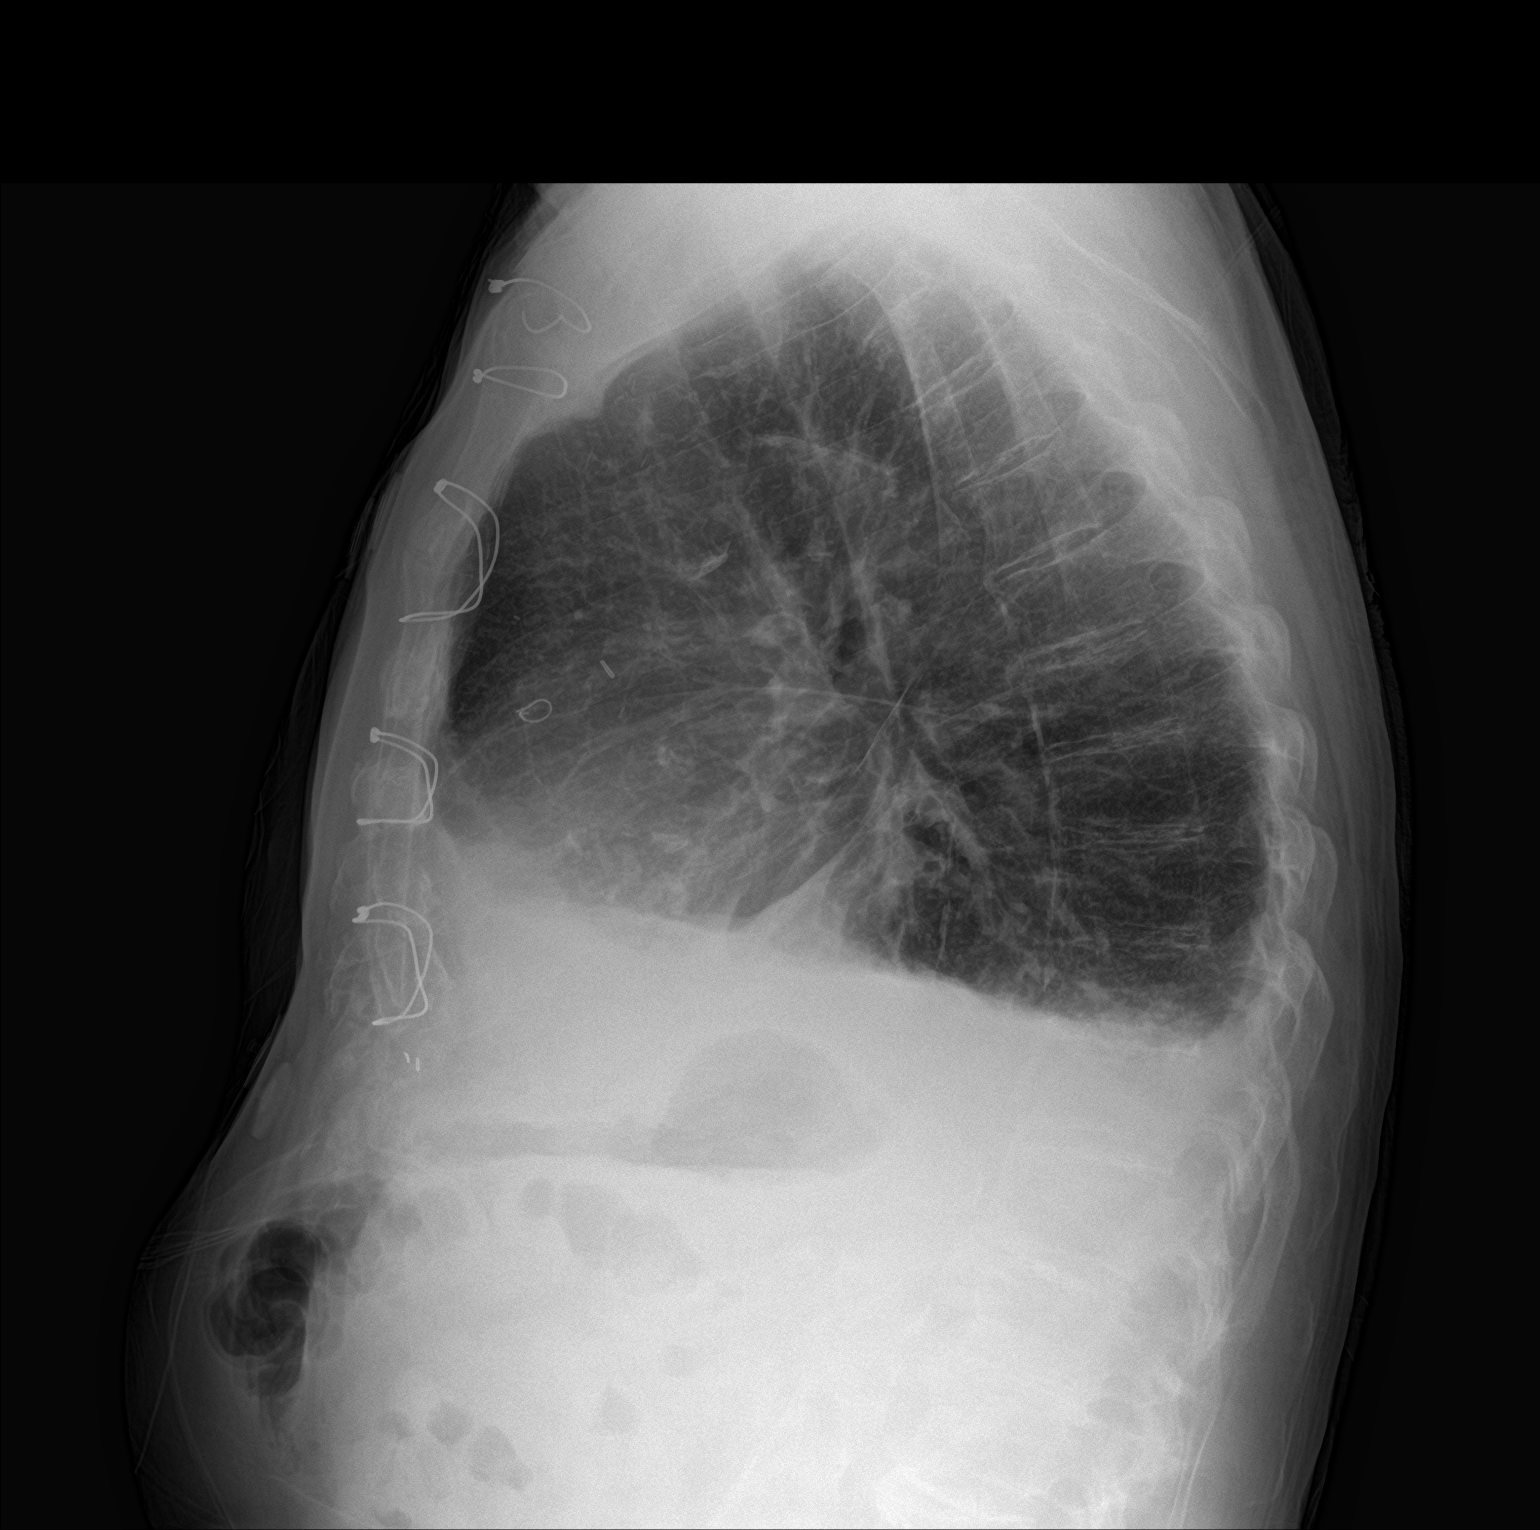

[2 of 2 positions shown; findings below may reference images not displayed]

FINDINGS: Stable configuration of sternotomy wires with stable discontinuity
in the upper most sternotomy wire. Surgical clips overlie the medial
left lower neck and left upper abdomen. Stable cardiomediastinal
silhouette with mild cardiomegaly and aortic atherosclerosis. No
pneumothorax. Small bilateral pleural effusions appear stable.
Borderline mild pulmonary edema. Patchy bibasilar lung opacities.
IMPRESSION: 1. Stable mild cardiomegaly and borderline mild pulmonary edema,
suggesting mild congestive heart failure.
2. Stable small bilateral pleural effusions .
3. Patchy bibasilar lung opacities, favor atelectasis .
4. Aortic atherosclerosis.

## 2018-06-05 IMAGING — CT CT CHEST W/O CM
2 of 3 series · 15 of 36 positions shown, 18 images · non-contrast
Comparison: Chest radiographs 06/06/2016 and earlier. CT Abdomen
and Pelvis 12/13/2014

CLINICAL DATA: [AGE] male with shortness of breath for 2
months. Bilateral pleural effusions on recent radiographs.

EXAM:
CT CHEST WITHOUT CONTRAST
TECHNIQUE: Multidetector CT imaging of the chest was performed following the
standard protocol without IV contrast.

[Series 2: thorax · axial · 0.74mm/px · z∈[-319,-33]mm · 12 of 169 slices shown, 15 images]
[im 13/169  mediastinal]
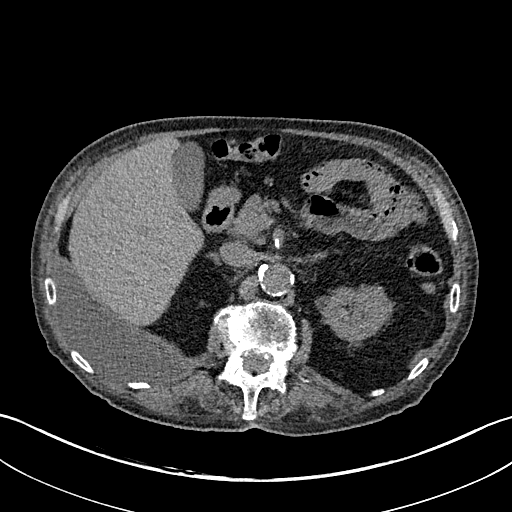
[im 13/169  lung]
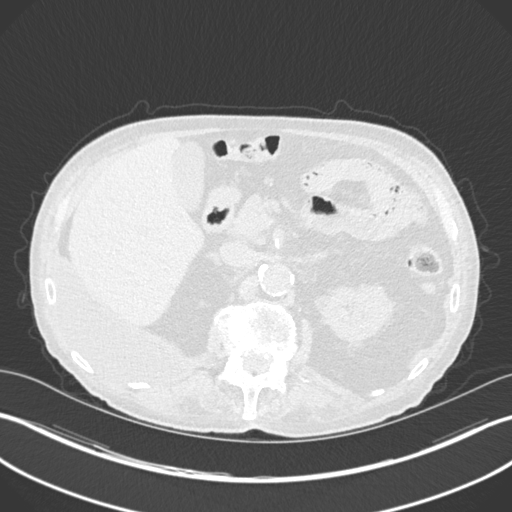
[im 25/169  lung]
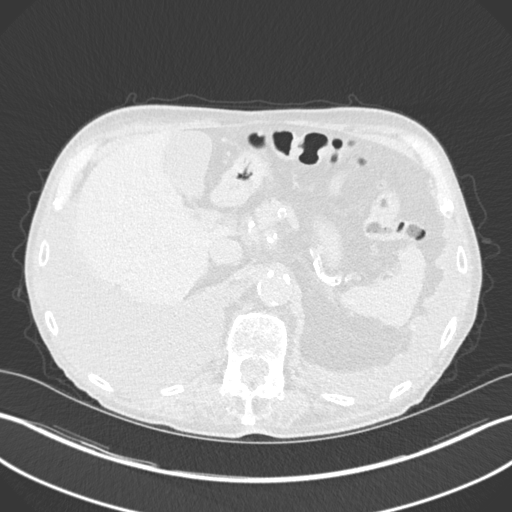
[im 38/169  lung]
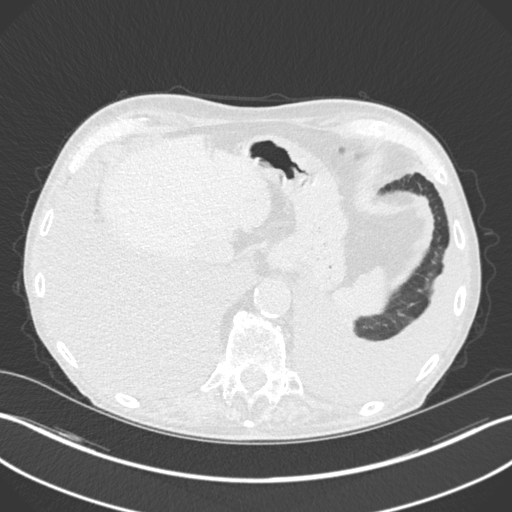
[im 50/169  lung]
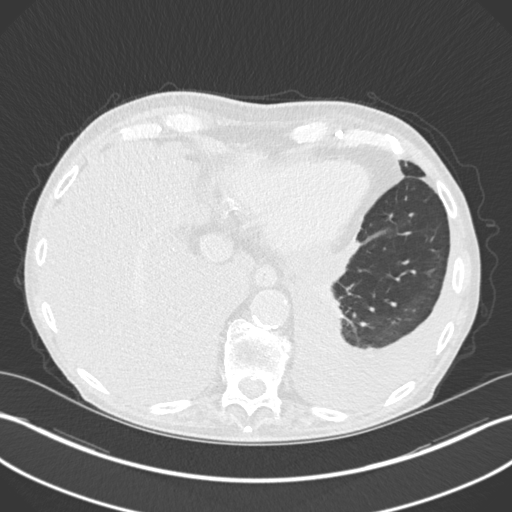
[im 63/169  mediastinal]
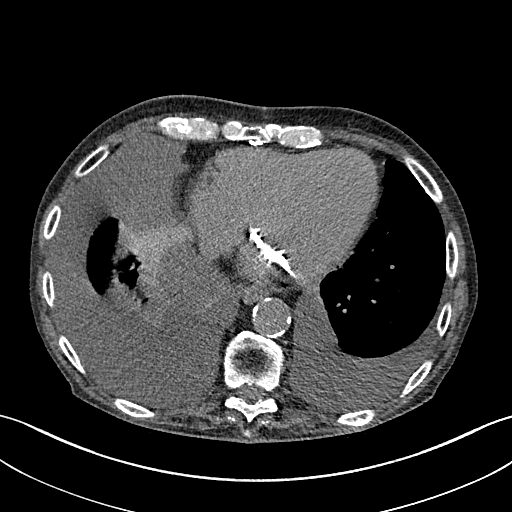
[im 63/169  lung]
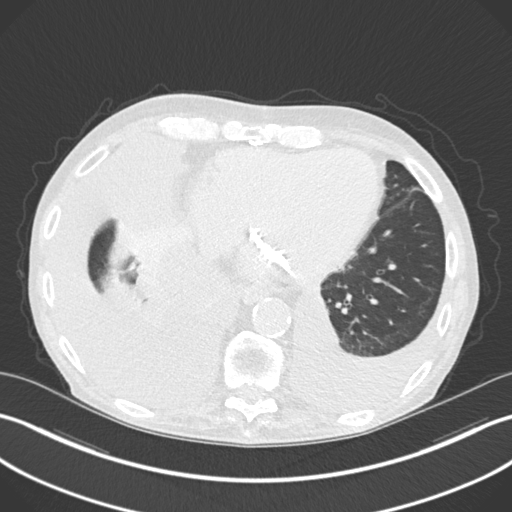
[im 75/169  lung]
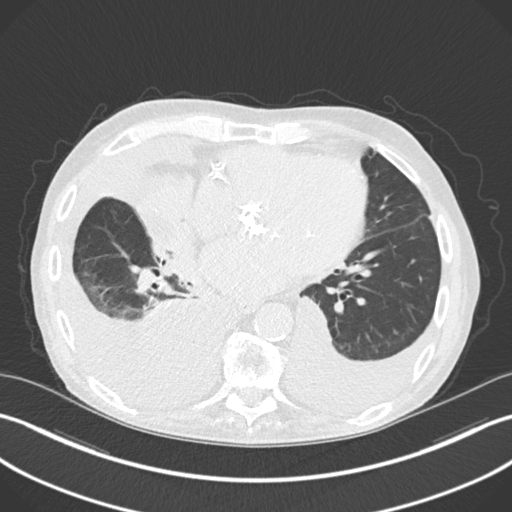
[im 94/169  lung]
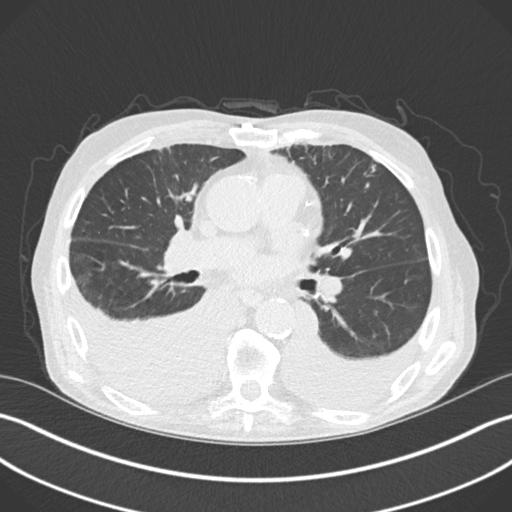
[im 106/169  lung]
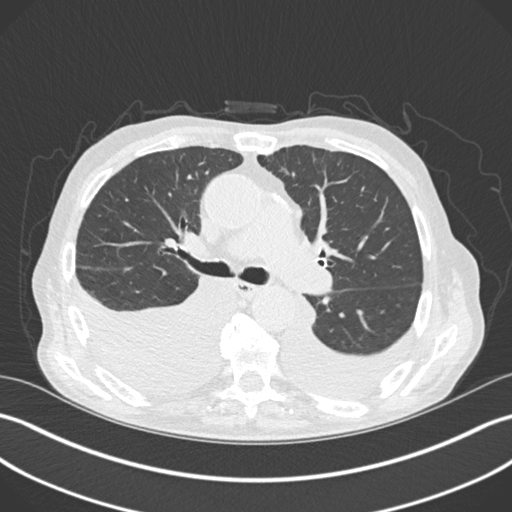
[im 119/169  mediastinal]
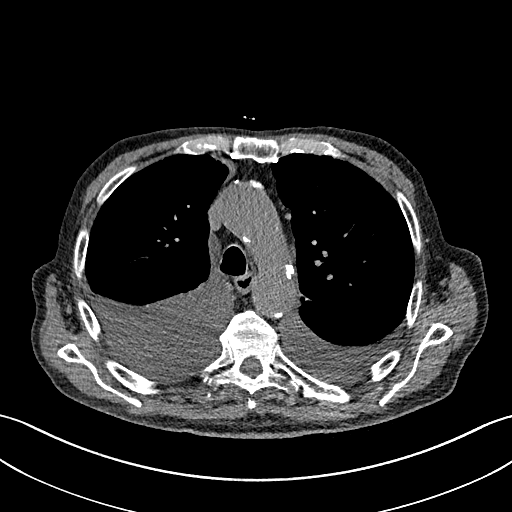
[im 119/169  lung]
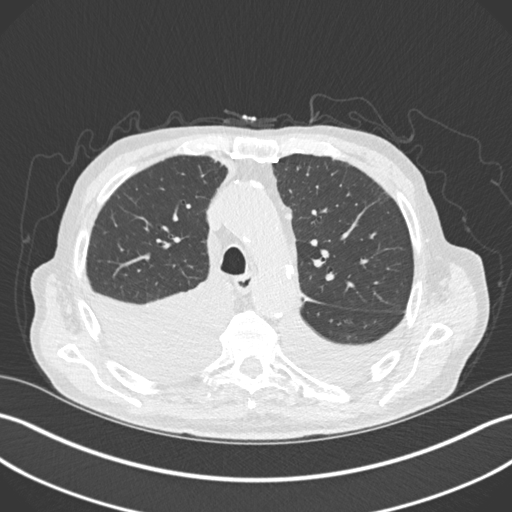
[im 131/169  lung]
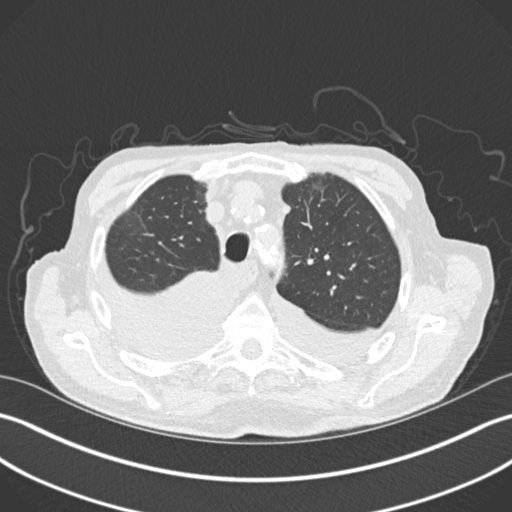
[im 144/169  lung]
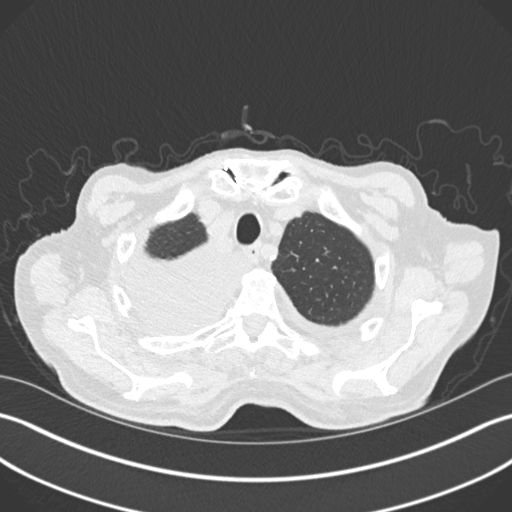
[im 156/169  lung]
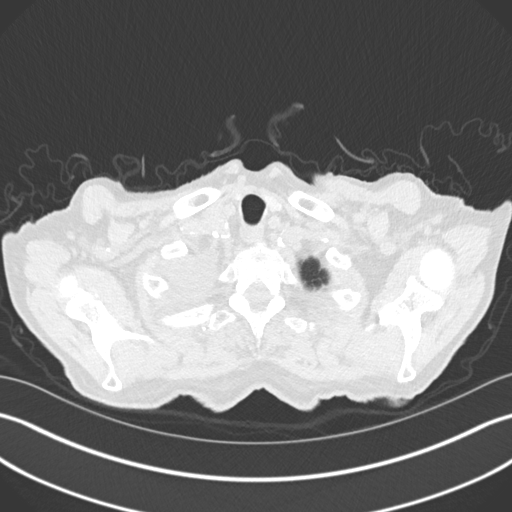

[Series 5: coronal · coronal · 0.71mm/px · 3 of 134 slices shown]
[im 27/134  lung]
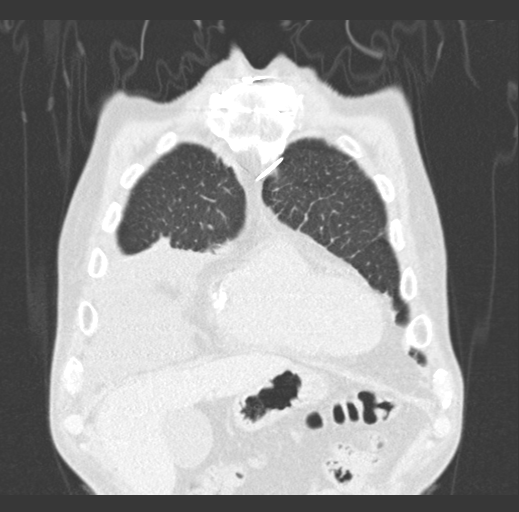
[im 54/134  lung]
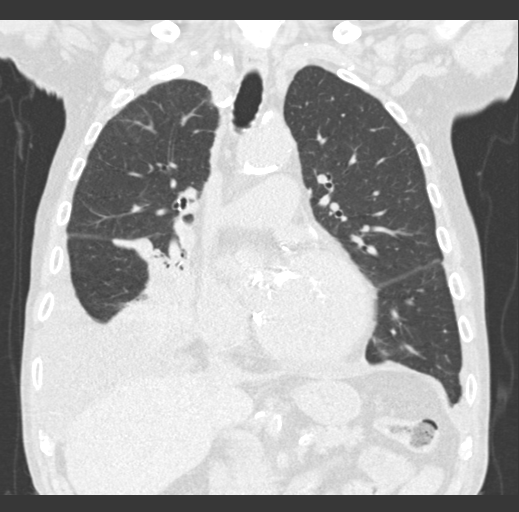
[im 80/134  lung]
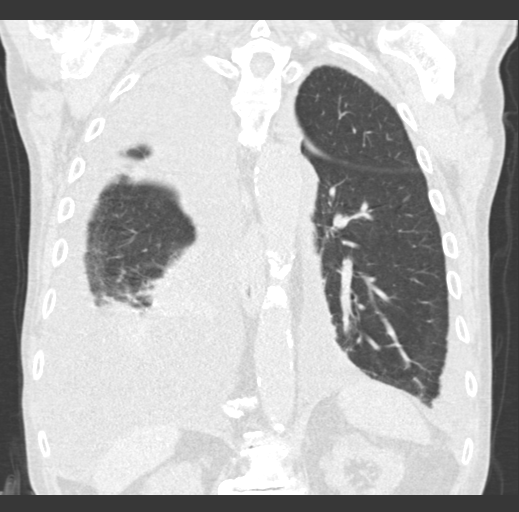

[15 of 36 positions shown; findings below may reference images not displayed]

FINDINGS: Cardiovascular: Prior CABG. Extensive chronic coronary artery and
aortic calcified atherosclerosis. No pericardial effusion.

Mediastinum/Nodes: Negative.  No lymphadenopathy.

Lungs/Pleura: Large layering and subpulmonic right pleural effusion,
layering 4-5 cm throughout the right hemithorax. Associated partial
compressive atelectasis of the right middle lobe and right lower
lobe. No definite superimposed right lung pneumonia.

Major airways remain patent.

Moderate layering left pleural effusion, layering 3-4 cm throughout
much of the left hemithorax. Lesser associated compressive left lung
atelectasis. No left lung pneumonia.

Nonspecific clustered anterior left upper lobe lung nodules on
series 3, image 78. This level was not included on the 7622 abdomen
CT.

Upper Abdomen: Negative visualized noncontrast liver, gallbladder,
spleen, pancreas, adrenal glands, kidneys, and proximal bowel in the
upper abdomen. Mild diverticulosis at the splenic flexure.

Musculoskeletal: Flowing osteophytes or syndesmophytes in the mid
and lower thoracic spine resulting in multilevel chronic thoracic
interbody ankylosis. Healed median sternotomy. No acute osseous
abnormality identified.
IMPRESSION: 1. Large layering right and moderate layering left pleural effusions
with bilateral compressive atelectasis.
2. No superimposed pneumonia suspected. There is a cluster of small
lung nodules in the anterior left upper lobe which appears
postinflammatory. These could be followed with a repeat noncontrast
chest CT in 1 year if clinically desired.
3. Prior CABG. Extensive calcified aortic and coronary artery
atherosclerosis.
